# Patient Record
Sex: Female | Born: 1935 | Race: Black or African American | Hispanic: No | Smoking: Never smoker
Health system: Southern US, Community
[De-identification: ages and names within clinical notes are randomized; demographics above are authoritative.]

## PROBLEM LIST (undated history)

## (undated) DIAGNOSIS — D649 Anemia, unspecified: Secondary | ICD-10-CM

## (undated) DIAGNOSIS — I639 Cerebral infarction, unspecified: Secondary | ICD-10-CM

## (undated) DIAGNOSIS — K922 Gastrointestinal hemorrhage, unspecified: Secondary | ICD-10-CM

## (undated) DIAGNOSIS — H409 Unspecified glaucoma: Secondary | ICD-10-CM

## (undated) DIAGNOSIS — N189 Chronic kidney disease, unspecified: Secondary | ICD-10-CM

## (undated) DIAGNOSIS — G35 Multiple sclerosis: Secondary | ICD-10-CM

## (undated) DIAGNOSIS — I5030 Unspecified diastolic (congestive) heart failure: Secondary | ICD-10-CM

## (undated) DIAGNOSIS — I10 Essential (primary) hypertension: Secondary | ICD-10-CM

## (undated) DIAGNOSIS — E7800 Pure hypercholesterolemia, unspecified: Secondary | ICD-10-CM

## (undated) DIAGNOSIS — E039 Hypothyroidism, unspecified: Secondary | ICD-10-CM

## (undated) DIAGNOSIS — F0390 Unspecified dementia without behavioral disturbance: Secondary | ICD-10-CM

## (undated) HISTORY — PX: COLECTOMY: SHX59

## (undated) HISTORY — PX: CATARACT EXTRACTION, BILATERAL: SHX1313

---

## 2016-02-15 ENCOUNTER — Observation Stay (HOSPITAL_COMMUNITY)
Admission: EM | Admit: 2016-02-15 | Discharge: 2016-02-17 | Disposition: A | Discharge status: Skilled Nursing Facility | Payer: Medicare Other | Attending: Family Medicine | Admitting: Family Medicine

## 2016-02-15 ENCOUNTER — Emergency Department (HOSPITAL_COMMUNITY): Payer: Medicare Other

## 2016-02-15 ENCOUNTER — Encounter (HOSPITAL_COMMUNITY): Payer: Self-pay

## 2016-02-15 DIAGNOSIS — E785 Hyperlipidemia, unspecified: Secondary | ICD-10-CM | POA: Diagnosis not present

## 2016-02-15 DIAGNOSIS — F0390 Unspecified dementia without behavioral disturbance: Secondary | ICD-10-CM | POA: Diagnosis not present

## 2016-02-15 DIAGNOSIS — Z7982 Long term (current) use of aspirin: Secondary | ICD-10-CM | POA: Insufficient documentation

## 2016-02-15 DIAGNOSIS — E039 Hypothyroidism, unspecified: Secondary | ICD-10-CM | POA: Diagnosis not present

## 2016-02-15 DIAGNOSIS — D631 Anemia in chronic kidney disease: Secondary | ICD-10-CM | POA: Diagnosis not present

## 2016-02-15 DIAGNOSIS — L03116 Cellulitis of left lower limb: Secondary | ICD-10-CM | POA: Diagnosis not present

## 2016-02-15 DIAGNOSIS — Z8673 Personal history of transient ischemic attack (TIA), and cerebral infarction without residual deficits: Secondary | ICD-10-CM | POA: Insufficient documentation

## 2016-02-15 DIAGNOSIS — R400 Somnolence: Secondary | ICD-10-CM

## 2016-02-15 DIAGNOSIS — G35 Multiple sclerosis: Secondary | ICD-10-CM | POA: Insufficient documentation

## 2016-02-15 DIAGNOSIS — N179 Acute kidney failure, unspecified: Secondary | ICD-10-CM | POA: Insufficient documentation

## 2016-02-15 DIAGNOSIS — Z681 Body mass index (BMI) 19 or less, adult: Secondary | ICD-10-CM | POA: Diagnosis not present

## 2016-02-15 DIAGNOSIS — F0280 Dementia in other diseases classified elsewhere without behavioral disturbance: Secondary | ICD-10-CM

## 2016-02-15 DIAGNOSIS — I5030 Unspecified diastolic (congestive) heart failure: Secondary | ICD-10-CM | POA: Insufficient documentation

## 2016-02-15 DIAGNOSIS — I130 Hypertensive heart and chronic kidney disease with heart failure and stage 1 through stage 4 chronic kidney disease, or unspecified chronic kidney disease: Secondary | ICD-10-CM | POA: Insufficient documentation

## 2016-02-15 DIAGNOSIS — E46 Unspecified protein-calorie malnutrition: Secondary | ICD-10-CM | POA: Diagnosis not present

## 2016-02-15 DIAGNOSIS — L0390 Cellulitis, unspecified: Secondary | ICD-10-CM | POA: Diagnosis present

## 2016-02-15 DIAGNOSIS — N189 Chronic kidney disease, unspecified: Secondary | ICD-10-CM | POA: Insufficient documentation

## 2016-02-15 DIAGNOSIS — R531 Weakness: Secondary | ICD-10-CM | POA: Insufficient documentation

## 2016-02-15 DIAGNOSIS — Z79899 Other long term (current) drug therapy: Secondary | ICD-10-CM | POA: Diagnosis not present

## 2016-02-15 DIAGNOSIS — R296 Repeated falls: Secondary | ICD-10-CM | POA: Diagnosis not present

## 2016-02-15 DIAGNOSIS — M25473 Effusion, unspecified ankle: Secondary | ICD-10-CM | POA: Diagnosis present

## 2016-02-15 DIAGNOSIS — R410 Disorientation, unspecified: Secondary | ICD-10-CM | POA: Diagnosis not present

## 2016-02-15 DIAGNOSIS — E860 Dehydration: Secondary | ICD-10-CM | POA: Insufficient documentation

## 2016-02-15 DIAGNOSIS — G309 Alzheimer's disease, unspecified: Secondary | ICD-10-CM

## 2016-02-15 HISTORY — DX: Unspecified diastolic (congestive) heart failure: I50.30

## 2016-02-15 HISTORY — DX: Essential (primary) hypertension: I10

## 2016-02-15 HISTORY — DX: Pure hypercholesterolemia, unspecified: E78.00

## 2016-02-15 HISTORY — DX: Multiple sclerosis: G35

## 2016-02-15 HISTORY — DX: Gastrointestinal hemorrhage, unspecified: K92.2

## 2016-02-15 HISTORY — DX: Anemia, unspecified: D64.9

## 2016-02-15 HISTORY — DX: Unspecified glaucoma: H40.9

## 2016-02-15 HISTORY — DX: Cerebral infarction, unspecified: I63.9

## 2016-02-15 HISTORY — DX: Unspecified dementia, unspecified severity, without behavioral disturbance, psychotic disturbance, mood disturbance, and anxiety: F03.90

## 2016-02-15 HISTORY — DX: Chronic kidney disease, unspecified: N18.9

## 2016-02-15 HISTORY — DX: Hypothyroidism, unspecified: E03.9

## 2016-02-15 LAB — CBC WITH DIFFERENTIAL/PLATELET
Basophils Absolute: 0 K/uL (ref 0.0–0.1)
Basophils Relative: 0 %
Eosinophils Absolute: 0 K/uL (ref 0.0–0.7)
Eosinophils Relative: 1 %
HCT: 32.1 % — ABNORMAL LOW (ref 36.0–46.0)
Hemoglobin: 10.7 g/dL — ABNORMAL LOW (ref 12.0–15.0)
Lymphocytes Relative: 22 %
Lymphs Abs: 1.1 K/uL (ref 0.7–4.0)
MCH: 30.4 pg (ref 26.0–34.0)
MCHC: 33.3 g/dL (ref 30.0–36.0)
MCV: 91.2 fL (ref 78.0–100.0)
Monocytes Absolute: 0.3 K/uL (ref 0.1–1.0)
Monocytes Relative: 6 %
Neutro Abs: 3.6 K/uL (ref 1.7–7.7)
Neutrophils Relative %: 71 %
Platelets: 170 K/uL (ref 150–400)
RBC: 3.52 MIL/uL — ABNORMAL LOW (ref 3.87–5.11)
RDW: 14.8 % (ref 11.5–15.5)
WBC: 5 K/uL (ref 4.0–10.5)

## 2016-02-15 LAB — URINALYSIS, ROUTINE W REFLEX MICROSCOPIC
Bilirubin Urine: NEGATIVE
Glucose, UA: NEGATIVE mg/dL
Hgb urine dipstick: NEGATIVE
Ketones, ur: NEGATIVE mg/dL
Nitrite: NEGATIVE
Protein, ur: NEGATIVE mg/dL
Specific Gravity, Urine: 1.015 (ref 1.005–1.030)
pH: 5 (ref 5.0–8.0)

## 2016-02-15 LAB — COMPREHENSIVE METABOLIC PANEL WITH GFR
ALT: 39 U/L (ref 14–54)
AST: 53 U/L — ABNORMAL HIGH (ref 15–41)
Albumin: 3 g/dL — ABNORMAL LOW (ref 3.5–5.0)
Alkaline Phosphatase: 52 U/L (ref 38–126)
Anion gap: 12 (ref 5–15)
BUN: 73 mg/dL — ABNORMAL HIGH (ref 6–20)
CO2: 24 mmol/L (ref 22–32)
Calcium: 9.1 mg/dL (ref 8.9–10.3)
Chloride: 106 mmol/L (ref 101–111)
Creatinine, Ser: 1.8 mg/dL — ABNORMAL HIGH (ref 0.44–1.00)
GFR calc Af Amer: 28 mL/min — ABNORMAL LOW (ref >60)
GFR calc non Af Amer: 24 mL/min — ABNORMAL LOW (ref >60)
Glucose, Bld: 169 mg/dL — ABNORMAL HIGH (ref 65–99)
Potassium: 3.6 mmol/L (ref 3.5–5.1)
Sodium: 142 mmol/L (ref 135–145)
Total Bilirubin: 0.4 mg/dL (ref 0.3–1.2)
Total Protein: 6.8 g/dL (ref 6.5–8.1)

## 2016-02-15 LAB — PROTIME-INR
INR: 1.03
PROTHROMBIN TIME: 13.5 s (ref 11.4–15.2)

## 2016-02-15 LAB — I-STAT CG4 LACTIC ACID, ED
Lactic Acid, Venous: 1.55 mmol/L (ref 0.5–1.9)
Lactic Acid, Venous: 2.04 mmol/L (ref 0.5–1.9)

## 2016-02-15 MED ORDER — SODIUM CHLORIDE 0.9 % IV SOLN
Freq: Once | INTRAVENOUS | Status: AC
  Administered 2016-02-15: via INTRAVENOUS

## 2016-02-15 MED ORDER — CEPHALEXIN 250 MG PO CAPS
500.0000 mg | ORAL_CAPSULE | Freq: Once | ORAL | Status: AC
  Administered 2016-02-15: 500 mg via ORAL
  Filled 2016-02-15: qty 2

## 2016-02-15 NOTE — ED Notes (Addendum)
Attempted Iv. Very hard stick. No success

## 2016-02-15 NOTE — ED Notes (Signed)
Pt transported to CT ?

## 2016-02-15 NOTE — H&P (Signed)
Family Medicine Teaching [facility] Admission History and Physical Service Pager: [phone]  Patient name: Kayla Li Medical record number: [id number] Date of birth: 1926/05/15 Age: 83 y.o. Gender: female  Primary Care Provider: No primary care provider on file. Dr. Mirna Mires, [facility] Consultants: none Code Status: FULL  Chief Complaint: fatigue and L leg wound  Assessment and Plan: Kayla Li is a 83 y.o. female presenting with somnolence, fatigue and L leg wound. PMH is significant for HFpEF, HTN, HLD, Multiple sclerosis, hypothyroidism, anemia, CKD, dementia, h/o CVA.  #Delirium 2/2 L leg cellulitis and dehydration. Per daughter patient had an  episode of increased activity/wakefulness for 3 days before becoming somnolent with progressive weakness and falls over the last 1 week and was found to have a L leg wound with erythema and L LE edema. Afebrile with WBC 5.0, lactic acid initially 2.04 improved to 1.55 with minimal PO/IV hydration. Does not meet SIRS or qSOFA, no concern for sepsis. Received po keflex in the ED to cover for both soft tissue infection and possible UTI (low suspicion but has had in past). Does have some baseline generalized weakness but suspect infection is cause for poor po intake and progressive fatigue. Low suspicion for UTI given no dysuria, suprapubic tenderness and UA with only small leukocytes. CXR neg. - Place in observation, attending Dr. Deirdre Priest - continue po keflex 500mg  q6h - follow up on blood and urine cultures - monitor on telemetry given HR in 50s - PT consult, appreciate recommendations - Wound care consult, appreciate recommendations - NS@75cc /hr  #AKI on CKD. Likely due to dehydration secondary to poor po intake. Per care everywhere has baseline creatinine of 1.5 in 2013. On admit creatinine 1.8 -  Gentle hydration NS@75cc /hr given HFpEF - hold nephrotoxic meds - monitor creatinine  #Generalized weakness with falls. Patient  has had 2 falls this weak in the setting of dehydration 2/2 L leg cellulitis. Slid down from bed to floor and did not hit head. CT head neg on admit. Suspect some component of malnutrition since per daughter only eats sporadically at home and prefers soft diet. Albumin mildly low at 3.0.  - PT consult, appreciate recommendations - Nutrition consult, appreciate recommendations  #HFpEF, stable. Does not appear to be in acute exacerbation though does have b/l LE edema in her feet with L greater than R from cellulitis. Last echo (03/15/12) EF>65% G1DD followed by [facility] cardiology. At home on coreg, enalapril, hydralazine - continue home meds except holding ACEi d/t AKI  #HTN. Normotensive on admission. At home on coreg, enalapril, hydralazine. - continue home meds except holding ACEi d/t AKI  #HLD. At home on pravastatin - continue statin  #Hypothyroidism - continue home levothyroxine  #Anemia, stable. baseline appears to be ~10.5 per care everywhere. On at home iron supplementation. - continue home med  #h/o CVA - continue home ASA81 and statin  #h/o Multiple sclerosis, stable. No focal deficits. Low suspicion for flare but does have weakness.   FEN/GI: Soft diet per daughter's request Prophylaxis: lovenox (renally dosed)  Disposition: likely home pending medical improvement  History of Present Illness:  Kayla Li is a 83 y.o. female presenting with fatigue, increased weakness and L leg wound. Daughter is historian.  Per daughter, patient was in usual state of health 1 week ago. At baseline, is oriented to self, talks, can stand with walker but uses wheelchair for mobility, feeds herself. Then patient had an episode of increased activity/wakefulness for 3 days straight before becoming somnolent with  progressive weakness over the last week. She fell on Wednesday and Friday of this week where she was already low to the ground but slid off bed onto floor and did not hit her head.   Daughter states she may have noticed this change in mental status with previous infections like UTIs in the past. Daughter states she noticed a L leg wound on patient's thigh during this time frame that she thought could be a "bed sore." Has noticed redness and swelling surrounding wound as well as swelling in L foot. States patient has been sporadically eating, ate breakfast today but states water intake is decreased because has noticed decreased UOP the last few days.  Review Of Systems: Per HPI with the following additions:   Review of Systems  Constitutional: Positive for malaise/fatigue. Negative for chills and fever.  Respiratory: Negative for shortness of breath.   Cardiovascular: Negative for chest pain.  Gastrointestinal: Negative for abdominal pain, constipation, diarrhea, nausea and vomiting.  Genitourinary: Negative for dysuria, frequency, hematuria and urgency.  Musculoskeletal: Positive for falls.  Skin:       Wound  Neurological: Positive for weakness. Negative for focal weakness.    Patient Active Problem List   Diagnosis Date Noted  . AKI (acute kidney injury) (HCC) 02/15/2016    Past Medical History: Past Medical History:  Diagnosis Date  . Anemia   . CKD (chronic kidney disease)   . Dementia   . Diastolic CHF (HCC)    History of severe LV dysfunction with LVEF 20%, resolved with LVEF 65% 11/2010  . GI bleeding   . Glaucoma   . High cholesterol   . Hypertension   . Hypothyroidism   . MS (multiple sclerosis) (HCC)   . Stroke [facility])     Past Surgical History: Past Surgical History:  Procedure Laterality Date  . CATARACT EXTRACTION, BILATERAL    . COLECTOMY     partial with anastamosis    Social History: Social History  Substance Use Topics  . Smoking status: Never Smoker  . Smokeless tobacco: Never Used  . Alcohol use No   Additional social history: Lives with daughter. No EtOH, recreational drug use.  Please also refer to relevant sections of  EMR.  Family History: Family History  Problem Relation Age of Onset  . Stroke Mother   . Stroke Father     Allergies and Medications: No Known Allergies No current facility-administered medications on file prior to encounter.    No current outpatient prescriptions on file prior to encounter.    Objective: BP (!) 123/37 (BP Location: Right Arm)   Pulse (!) 50   Temp 97.6 F (36.4 C) (Oral)   Resp 18   Ht 5\' 1"  (1.549 m)   Wt 43.1 kg (95 lb)   SpO2 100%   BMI 17.95 kg/m  Exam: General: elderly frail appearing woman lying comfortably in bed, in no distress.  Eyes: PERRL, EOMI. ENTM: MMM, minimal nasal discharge Neck: supple, normal ROM Cardiovascular: bradycardic, normal S1 and S2. No murmurs Respiratory: CTAB, normal effort on room air. Transmitted upper airway noise.  Gastrointestinal: soft, nontender, nondistended + bowel sounds MSK: moving limbs spontaneously, pitting pedal edema of L > R Derm: L lateral thigh has shallow superficial ulceration 2 cm in diameter with surrounding erythema and edema - please see image below. Also has few small superficial scrapes on b/l shins and R knee. Neuro: Oriented to self, awake. Follows commands and can answer simple questions. No focal deficits Psych: appropriate  affect      Labs and Imaging: CBC BMET   Recent Labs Lab 02/15/16 1734  WBC 5.0  HGB 10.7*  HCT 32.1*  PLT 170    Recent Labs Lab 02/15/16 1734  NA 142  K 3.6  CL 106  CO2 24  BUN 73*  CREATININE 1.80*  GLUCOSE 169*  CALCIUM 9.1     Lactic Acid 2.04 -> 1.55  Urinalysis    Component Value Date/Time   COLORURINE YELLOW 02/15/2016 2200   APPEARANCEUR CLEAR 02/15/2016 2200   LABSPEC 1.015 02/15/2016 2200   PHURINE 5.0 02/15/2016 2200   GLUCOSEU NEGATIVE 02/15/2016 2200   HGBUR NEGATIVE 02/15/2016 2200   BILIRUBINUR NEGATIVE 02/15/2016 2200   KETONESUR NEGATIVE 02/15/2016 2200   PROTEINUR NEGATIVE 02/15/2016 2200   NITRITE NEGATIVE 02/15/2016  2200   LEUKOCYTESUR SMALL (A) 02/15/2016 2200     Dg Chest 2 View  Result Date: 02/15/2016 CLINICAL DATA:  Pt unable to give history and aloc. Per ED Notes; Patient here with daughter in which patient resides. Patient here with altered loc, increased sleeping and bilateral ankle swelling the past week. No work of breathing noted, will open eyes and speak with verbal stimulation. Daughter also reports new wound to left posterior hip. afebrile EXAM: CHEST  2 VIEW COMPARISON:  None. FINDINGS: Cardiac silhouette is mildly enlarged. No mediastinal or hilar masses and no evidence of adenopathy. Clear lungs.  No pleural effusion or pneumothorax. Skeletal structures are demineralized but grossly intact. IMPRESSION: No active cardiopulmonary disease. Electronically Signed   By: Amie Portland M.D.   On: 02/15/2016 17:50   Ct Head Wo Contrast  Result Date: 02/15/2016 CLINICAL DATA:  Altered mental status, increased sleeping and BILATERAL ankle swelling over past week, history CHF, hypertension, multiple sclerosis EXAM: CT HEAD WITHOUT CONTRAST TECHNIQUE: Contiguous axial images were obtained from the base of the skull through the vertex without intravenous contrast. COMPARISON:  None FINDINGS: Brain: Generalized atrophy. Normal ventricular morphology. No midline shift or mass effect. Small vessel chronic ischemic changes of deep cerebral white matter. No intracranial hemorrhage, mass lesion, evidence of acute infarction, or extra-axial fluid collection. Vascular: Unremarkable Skull: Intact Sinuses/Orbits: Clear Other: N/A IMPRESSION: Atrophy with small vessel chronic ischemic changes of deep cerebral white matter. No acute intracranial abnormalities. Electronically Signed   By: Ulyses Southward M.D.   On: 02/15/2016 22:51    Leland Her, DO 02/15/2016, 11:23 PM PGY-1, [facility] FPTS Intern pager: [phone], text pages welcome  Upper Level Addendum:  I have seen and evaluated this patient along  with Dr. Artist Pais and reviewed the above note, making necessary revisions in red.   Dani Gobble, MD, PGY-2 02/16/2016 1:47 AM

## 2016-02-15 NOTE — ED Provider Notes (Signed)
[facility]-EMERGENCY DEPT Provider Note   CSN: [id number] Arrival date & time: 02/15/16  1701     History   Chief Complaint Chief Complaint  Patient presents with  . ankle swelling/ altered loc    HPI Nalla Taulbee is a 83 y.o. female.  The history is provided by the patient and a relative.  Altered Mental Status   This is a new problem. Episode onset: 1 week. The problem has been gradually worsening. Associated symptoms include somnolence and weakness (generalized). Pertinent negatives include no confusion.  Her daughter states he has a history of MS. She states she has had gradually worsening fatigue and lethargy for the past week. She is not having any fevers, cough, congestion, sputum production, chest pain, shortness of breath, nausea, vomiting, diarrhea. Patient denies pain at this time. The daughter states that she has been difficult to arouse. She has also had poor PO intake for the past week. The daughters also noticed a sore on her left leg is worsening redness and swelling around it that she just noticed yesterday. She also slid off the bed onto the floor yesterday. Daughter states she did not hit her head.  Past Medical History:  Diagnosis Date  . Anemia   . CKD (chronic kidney disease)   . Diastolic CHF (HCC)   . High cholesterol   . Hypertension   . Hypothyroidism   . MS (multiple sclerosis) [facility])     Patient Active Problem List   Diagnosis Date Noted  . AKI (acute kidney injury) (HCC) 02/15/2016    History reviewed. No pertinent surgical history.  OB History    No data available       Home Medications    Prior to Admission medications   Medication Sig Start Date End Date Taking? Authorizing Provider  aspirin EC 81 MG tablet Take 81 mg by mouth daily.   Yes Historical Provider, MD  Calcium Carb-Cholecalciferol (CALCIUM 600+D3 PO) Take 1 tablet by mouth daily.   Yes Historical Provider, MD  carvedilol (COREG) 25 MG tablet Take 25 mg by mouth 2 (two) times  daily with a meal.   Yes Historical Provider, MD  enalapril (VASOTEC) 10 MG tablet Take 10 mg by mouth daily.   Yes Historical Provider, MD  ferrous sulfate 324 (65 Fe) MG TBEC Take 1 tablet by mouth daily.   Yes Historical Provider, MD  hydrALAZINE (APRESOLINE) 50 MG tablet Take 50 mg by mouth 3 (three) times daily.   Yes Historical Provider, MD  levothyroxine (SYNTHROID, LEVOTHROID) 25 MCG tablet Take 25 mcg by mouth daily before breakfast.   Yes Historical Provider, MD  pravastatin (PRAVACHOL) 80 MG tablet Take 80 mg by mouth at bedtime.   Yes Historical Provider, MD  vitamin B-12 (CYANOCOBALAMIN) 500 MCG tablet Take 500 mcg by mouth daily.   Yes Historical Provider, MD    Family History No family history on file.  Social History Social History  Substance Use Topics  . Smoking status: Never Smoker  . Smokeless tobacco: Never Used  . Alcohol use Not on file     Allergies   Patient has no known allergies.   Review of Systems Review of Systems  Unable to perform ROS: Mental status change  Neurological: Positive for weakness (generalized).  Psychiatric/Behavioral: Negative for confusion.     Physical Exam Updated Vital Signs BP (!) 123/37 (BP Location: Right Arm)   Pulse (!) 50   Temp 97.6 F (36.4 C) (Oral)   Resp 18   Ht  5\' 1"  (1.549 m)   Wt 43.1 kg   SpO2 100%   BMI 17.95 kg/m   Physical Exam  Constitutional: She appears lethargic. She appears cachectic. She appears ill. No distress.  HENT:  Head: Atraumatic.  Nose: Nose normal.  Mouth/Throat: Uvula is midline. Mucous membranes are dry.  Eyes: Conjunctivae are normal. Pupils are equal, round, and reactive to light.  Neck: Neck supple. No spinous process tenderness and no muscular tenderness present. No neck rigidity. Normal range of motion present.  Cardiovascular: Regular rhythm, S1 normal, S2 normal, normal heart sounds, intact distal pulses and normal pulses.  Bradycardia present.   Pulmonary/Chest: Effort  normal. She has decreased breath sounds (throughout). She has no wheezes. She has no rhonchi.  Abdominal: Normal appearance and bowel sounds are normal. She exhibits no distension. There is no tenderness.  Neurological: She appears lethargic. No cranial nerve deficit or sensory deficit. GCS eye subscore is 3. GCS verbal subscore is 4. GCS motor subscore is 6.  Generalized weakness but no focal deficits.   Skin: Skin is warm. Capillary refill takes less than 2 seconds. There is erythema (and induration with central superficial ulceration of about 2cm to L lateral thigh).     2 dime size abrasions to b/l knees, one to each knee.   Nursing note and vitals reviewed.    ED Treatments / Results  Labs (all labs ordered are listed, but only abnormal results are displayed) Labs Reviewed  COMPREHENSIVE METABOLIC PANEL - Abnormal; Notable for the following:       Result Value   Glucose, Bld 169 (*)    BUN 73 (*)    Creatinine, Ser 1.80 (*)    Albumin 3.0 (*)    AST 53 (*)    GFR calc non Af Amer 24 (*)    GFR calc Af Amer 28 (*)    All other components within normal limits  CBC WITH DIFFERENTIAL/PLATELET - Abnormal; Notable for the following:    RBC 3.52 (*)    Hemoglobin 10.7 (*)    HCT 32.1 (*)    All other components within normal limits  URINALYSIS, ROUTINE W REFLEX MICROSCOPIC - Abnormal; Notable for the following:    Leukocytes, UA SMALL (*)    Bacteria, UA RARE (*)    Squamous Epithelial / LPF 0-5 (*)    All other components within normal limits  I-STAT CG4 LACTIC ACID, ED - Abnormal; Notable for the following:    Lactic Acid, Venous 2.04 (*)    All other components within normal limits  URINE CULTURE  CULTURE, BLOOD (ROUTINE X 2)  CULTURE, BLOOD (ROUTINE X 2)  PROTIME-INR  I-STAT CG4 LACTIC ACID, ED    EKG  EKG Interpretation None       Radiology Dg Chest 2 View  Result Date: 02/15/2016 CLINICAL DATA:  Pt unable to give history and aloc. Per ED Notes; Patient here  with daughter in which patient resides. Patient here with altered loc, increased sleeping and bilateral ankle swelling the past week. No work of breathing noted, will open eyes and speak with verbal stimulation. Daughter also reports new wound to left posterior hip. afebrile EXAM: CHEST  2 VIEW COMPARISON:  None. FINDINGS: Cardiac silhouette is mildly enlarged. No mediastinal or hilar masses and no evidence of adenopathy. Clear lungs.  No pleural effusion or pneumothorax. Skeletal structures are demineralized but grossly intact. IMPRESSION: No active cardiopulmonary disease. Electronically Signed   By: Amie Portland M.D.   On: 02/15/2016  17:50   Ct Head Wo Contrast  Result Date: 02/15/2016 CLINICAL DATA:  Altered mental status, increased sleeping and BILATERAL ankle swelling over past week, history CHF, hypertension, multiple sclerosis EXAM: CT HEAD WITHOUT CONTRAST TECHNIQUE: Contiguous axial images were obtained from the base of the skull through the vertex without intravenous contrast. COMPARISON:  None FINDINGS: Brain: Generalized atrophy. Normal ventricular morphology. No midline shift or mass effect. Small vessel chronic ischemic changes of deep cerebral white matter. No intracranial hemorrhage, mass lesion, evidence of acute infarction, or extra-axial fluid collection. Vascular: Unremarkable Skull: Intact Sinuses/Orbits: Clear Other: N/A IMPRESSION: Atrophy with small vessel chronic ischemic changes of deep cerebral white matter. No acute intracranial abnormalities. Electronically Signed   By: Ulyses Southward M.D.   On: 02/15/2016 22:51    Procedures Procedures (including critical care time)  Medications Ordered in ED Medications  0.9 %  sodium chloride infusion (not administered)  cephALEXin (KEFLEX) capsule 500 mg (not administered)     Initial Impression / Assessment and Plan / ED Course  I have reviewed the triage vital signs and the nursing notes.  Pertinent labs & imaging results that  were available during my care of the patient were reviewed by me and considered in my medical decision making (see chart for details).  Clinical Course   83 year old female with a history of MS, HTN, CHF presenting with 1 week of worsening lethargy and generalized weakness. Further history is as above. Exam is as above and notable for an area of erythema and induration with central superficial ulceration over the left lateral thigh concerning for cellulitis. Nonfocal neuro exam. Pt arousable to verbal stimuli.  Labs notable for WBC 5, Hgb 10.7, Cr 1.8, BUN 73, LA 2.04, UA with small leukocytes and rare bacteria. CXR with no acute cardiopulmonary abnormality with no focal opacity or pulm edema. CT head unremarkable.  Due to elevated lactate and likely prerenal AK I, patient placed on normal saline infusion. Bolus not given due to history CHF with an unknown Ef.  Blood cultures drawn and Keflex given for concerns of cellulitis over the left lateral thigh and leukocytes on UA for possible UTI.  Patient will be admitted to family medicine for rehydration and further management.  Patient care discussed and supervised by my attending, Dr. Jacqulyn Bath. Azalia Bilis, MD   Final Clinical Impressions(s) / ED Diagnoses   Final diagnoses:  Somnolence  AKI (acute kidney injury) (HCC)  Dehydration  Cellulitis of left lower extremity    New Prescriptions New Prescriptions   No medications on file     Seith Aikey [place] Tkeya Stencil, MD 02/15/16 2329    Maia Plan, MD 02/16/16 [phone]

## 2016-02-15 NOTE — ED Triage Notes (Signed)
Patient here with daughter in which patient resides. Patient here with altered loc, increased sleeping and bilateral ankle swelling the past week. No work of breathing noted, will open eyes and speak with verbal stimulation. Daughter also reports new wound to left posterior hip. afebrile

## 2016-02-16 DIAGNOSIS — E860 Dehydration: Secondary | ICD-10-CM

## 2016-02-16 DIAGNOSIS — L0390 Cellulitis, unspecified: Secondary | ICD-10-CM | POA: Diagnosis present

## 2016-02-16 DIAGNOSIS — N179 Acute kidney failure, unspecified: Secondary | ICD-10-CM

## 2016-02-16 LAB — BASIC METABOLIC PANEL
Anion gap: 8 (ref 5–15)
BUN: 64 mg/dL — ABNORMAL HIGH (ref 6–20)
CALCIUM: 8.6 mg/dL — AB (ref 8.9–10.3)
CO2: 25 mmol/L (ref 22–32)
Chloride: 112 mmol/L — ABNORMAL HIGH (ref 101–111)
Creatinine, Ser: 1.49 mg/dL — ABNORMAL HIGH (ref 0.44–1.00)
GFR, EST AFRICAN AMERICAN: 35 mL/min — AB (ref >60)
GFR, EST NON AFRICAN AMERICAN: 30 mL/min — AB (ref >60)
GLUCOSE: 94 mg/dL (ref 65–99)
POTASSIUM: 3.3 mmol/L — AB (ref 3.5–5.1)
Sodium: 145 mmol/L (ref 135–145)

## 2016-02-16 LAB — CBC
HEMATOCRIT: 27.7 % — AB (ref 36.0–46.0)
HEMOGLOBIN: 9 g/dL — AB (ref 12.0–15.0)
MCH: 30.1 pg (ref 26.0–34.0)
MCHC: 32.5 g/dL (ref 30.0–36.0)
MCV: 92.6 fL (ref 78.0–100.0)
Platelets: 154 10*3/uL (ref 150–400)
RBC: 2.99 MIL/uL — ABNORMAL LOW (ref 3.87–5.11)
RDW: 15.1 % (ref 11.5–15.5)
WBC: 4.4 10*3/uL (ref 4.0–10.5)

## 2016-02-16 MED ORDER — ACETAMINOPHEN 650 MG RE SUPP: 650.0000 mg | Freq: Four times a day (QID) | RECTAL | Status: DC | PRN

## 2016-02-16 MED ORDER — ENOXAPARIN SODIUM 30 MG/0.3ML ~~LOC~~ SOLN
30.0000 mg | SUBCUTANEOUS | Status: DC
  Administered 2016-02-16 – 2016-02-17 (×2): 30 mg via SUBCUTANEOUS
  Filled 2016-02-16 (×2): qty 0.3

## 2016-02-16 MED ORDER — ASPIRIN EC 81 MG PO TBEC
81.0000 mg | DELAYED_RELEASE_TABLET | Freq: Every day | ORAL | Status: DC
  Administered 2016-02-16 – 2016-02-17 (×2): 81 mg via ORAL
  Filled 2016-02-16 (×2): qty 1

## 2016-02-16 MED ORDER — CEPHALEXIN 500 MG PO CAPS
500.0000 mg | ORAL_CAPSULE | Freq: Four times a day (QID) | ORAL | Status: DC
  Filled 2016-02-16: qty 1

## 2016-02-16 MED ORDER — HYDRALAZINE HCL 50 MG PO TABS
50.0000 mg | ORAL_TABLET | Freq: Three times a day (TID) | ORAL | Status: DC
  Administered 2016-02-16 – 2016-02-17 (×4): 50 mg via ORAL
  Filled 2016-02-16 (×5): qty 1

## 2016-02-16 MED ORDER — FERROUS SULFATE 325 (65 FE) MG PO TABS
325.0000 mg | ORAL_TABLET | Freq: Every day | ORAL | Status: DC
  Administered 2016-02-16 – 2016-02-17 (×2): 325 mg via ORAL
  Filled 2016-02-16 (×2): qty 1

## 2016-02-16 MED ORDER — SODIUM CHLORIDE 0.9 % IV SOLN
INTRAVENOUS | Status: DC
  Administered 2016-02-16 – 2016-02-17 (×2): via INTRAVENOUS

## 2016-02-16 MED ORDER — POLYETHYLENE GLYCOL 3350 17 G PO PACK: 17.0000 g | PACK | Freq: Every day | ORAL | Status: DC | PRN

## 2016-02-16 MED ORDER — CARVEDILOL 25 MG PO TABS
25.0000 mg | ORAL_TABLET | Freq: Two times a day (BID) | ORAL | Status: DC
  Administered 2016-02-16 – 2016-02-17 (×3): 25 mg via ORAL
  Filled 2016-02-16 (×3): qty 1

## 2016-02-16 MED ORDER — CEPHALEXIN 500 MG PO CAPS
500.0000 mg | ORAL_CAPSULE | Freq: Two times a day (BID) | ORAL | Status: DC
  Administered 2016-02-17: 500 mg via ORAL
  Filled 2016-02-16 (×2): qty 1

## 2016-02-16 MED ORDER — FERROUS SULFATE 324 (65 FE) MG PO TBEC: 1.0000 | DELAYED_RELEASE_TABLET | Freq: Every day | ORAL | Status: DC

## 2016-02-16 MED ORDER — PRAVASTATIN SODIUM 40 MG PO TABS
80.0000 mg | ORAL_TABLET | Freq: Every day | ORAL | Status: DC
  Filled 2016-02-16: qty 2

## 2016-02-16 MED ORDER — LEVOTHYROXINE SODIUM 25 MCG PO TABS
25.0000 ug | ORAL_TABLET | Freq: Every day | ORAL | Status: DC
  Administered 2016-02-17: 25 ug via ORAL
  Filled 2016-02-16: qty 1

## 2016-02-16 MED ORDER — ACETAMINOPHEN 325 MG PO TABS: 650.0000 mg | ORAL_TABLET | Freq: Four times a day (QID) | ORAL | Status: DC | PRN

## 2016-02-16 NOTE — Discharge Summary (Signed)
Family Medicine Teaching [facility] Discharge Summary  Patient name: Kayla Li Medical record number: [id number] Date of birth: 04-03-1926 Age: 83 y.o. Gender: female Date of Admission: 02/15/2016  Date of Discharge: 02/17/2016 Admitting Physician: Carney Living, MD  Primary Care Provider: No primary care provider on file. Consultants: none  Indication for Hospitalization: Delirium 2/2 L leg cellulitis and dehydration  Discharge Diagnoses/Problem List:  Delirium 2/2 L leg cellulitis and dehydration AKI on CKD Generalized weakness with falls HFpEF HTN HLD Hypothyroidism Anemia H/o CVA MS Protein calorie malnutrition  Disposition: SNF  Discharge Condition: Stable  Discharge Exam:  General: elderly frail appearing woman lying comfortably in bed, in no distress.  Cardiovascular: RRR, normal S1 and S2. No murmurs Respiratory: CTAB, normal effort on room air. Transmitted upper airway noise. Abdomen: soft, nontender, nondistended + bowel sounds Extremities: L lateral thigh has shallow superficial ulceration covered with dressing c/d/i with surrounding edema but no erythema. Also has few small superficial scrapes on b/l shins and R knee that are also covered with dressings. Continued 1+ pitting edema on dorsum of L foot that is no longer erythematous  Brief Hospital Course:  Kayla Li a 83 y.o.femalepresenting with somnolence, fatigue and L leg wound.   L leg cellulitis Per daughter patient had an episode of increased activity/wakefulness for 3 days before becoming somnolent with progressive weakness and falls over the last 1 week and was found to have a superficial L leg wound on lateral thigh with erythema and L LE edema. She had an elevated lactic acid on admission 2.04 that resolved with minimal PO/IV hydration. She was started on PO keflex to cover for cellulitis and for possible UTIs that she has had in the past that caused a similar clinical picture.  However, urinalysis was negative for nitrites with rare bacteria and patient had no dysuria.   Generalized weakness with falls  Patient had some low impact falls in the setting of dehydration 2/2 L leg cellulitis. She slid down from bed to floor and did not hit head, CT head neg on admit. Suspect some component of malnutrition since per daughter only eats sporadically at home and prefers soft diet. Nutrition was consulted and recommended ensure emliv 3 times a day with meals. PT was consulted and recommended SNF. Patient was stable for discharge to SNF.  Issues for Follow Up:  1. Patient was admitted for left leg cellulitis and was started on Keflex 500 mg twice a day. Patient is instructed to continue these medications for a total of 7 days treatment duration (1/14-1/21). Blood cultures remain negative to date. 2. Nutrition was consult concerning protein calorie malnutrition. Recommendations were made for ensure emliv 3 times a day with meals. 3. Patient experienced generalized weakness with falls secondary to what we believe was her weakness. PT was consulted and recommended SNF placement. This weakness was suspected to be secondary to dehydration and left leg cellulitis. 4. Patient remained hypertensive and continued Coreg, hydralazine. Enalapril was held given AKI which improved during hospitalization. Patient can restart this if creatinine remains improved. Please obtain a BMET to evaluate for resolution of AKI. 5. Patient is instructed to continue statin therapy, aspirin 81 mg daily, and home levothyroxine dose. 6. Family are interested in palliative evaluation considered during SNF care.  Significant Procedures: none  Significant Labs and Imaging:   Recent Labs Lab 02/15/16 1734 02/16/16 1107  WBC 5.0 4.4  HGB 10.7* 9.0*  HCT 32.1* 27.7*  PLT 170 154    Recent Labs  Lab 02/15/16 1734 02/16/16 1107 02/17/16 0748  NA 142 145 147*  K 3.6 3.3* 3.4*  CL 106 112* 117*  CO2 24 25 24    GLUCOSE 169* 94 74  BUN 73* 64* 47*  CREATININE 1.80* 1.49* 1.34*  CALCIUM 9.1 8.6* 8.5*  ALKPHOS 52  --   --   AST 53*  --   --   ALT 39  --   --   ALBUMIN 3.0*  --   --    Results/Tests Pending at Time of Discharge: none  Discharge Medications:  Allergies as of 02/17/2016   No Known Allergies     Medication List    STOP taking these medications   enalapril 10 MG tablet Commonly known as:  VASOTEC     TAKE these medications   aspirin EC 81 MG tablet Take 81 mg by mouth daily.   CALCIUM 600+D3 PO Take 1 tablet by mouth daily.   carvedilol 25 MG tablet Commonly known as:  COREG Take 25 mg by mouth 2 (two) times daily with a meal.   cephALEXin 500 MG capsule Commonly known as:  KEFLEX Take 1 capsule (500 mg total) by mouth every 12 (twelve) hours.   feeding supplement (ENSURE ENLIVE) Liqd Take 237 mLs by mouth 3 (three) times daily between meals.   ferrous sulfate 324 (65 Fe) MG Tbec Take 1 tablet by mouth daily.   hydrALAZINE 50 MG tablet Commonly known as:  APRESOLINE Take 50 mg by mouth 3 (three) times daily.   levothyroxine 25 MCG tablet Commonly known as:  SYNTHROID, LEVOTHROID Take 25 mcg by mouth daily before breakfast.   pravastatin 80 MG tablet Commonly known as:  PRAVACHOL Take 80 mg by mouth at bedtime.   vitamin B-12 500 MCG tablet Commonly known as:  CYANOCOBALAMIN Take 500 mcg by mouth daily.       Discharge Instructions: Please refer to Patient Instructions section of EMR for full details.  Patient was counseled important signs and symptoms that should prompt return to medical care, changes in medications, dietary instructions, activity restrictions, and follow up appointments.   Follow-Up Appointments:   Wendee Beavers, DO 02/17/2016, 4:28 PM PGY-1, [facility] Family Medicine

## 2016-02-16 NOTE — Evaluation (Signed)
Physical Therapy Evaluation Patient Details Name: Kayla Li MRN: [id number] DOB: May 04, 1926 Today's Date: 02/16/2016   History of Present Illness  Kayla Li is a 83 y.o. female presenting with somnolence, fatigue and L leg wound. PMH is significant for HFpEF, HTN, HLD, Multiple sclerosis, hypothyroidism, anemia, CKD, dementia, h/o CVA.  Clinical Impression   Pt admitted with above diagnosis. Pt currently with functional limitations due to the deficits listed below (see PT Problem List). Presents with difficulty transferring, LLE weakness effecting ability to move; Daughter reports difficulty managing at home; Rec post-acute rehab to maximize independence and safety with mobility;  Pt will benefit from skilled PT to increase their independence and safety with mobility to allow discharge to the venue listed below.    Noted Palliative Care Team Consult pending as well; Will be happy to take their lead as to if and how PT can fit into Kayla Li's goals of care.      Follow Up Recommendations SNF    Equipment Recommendations  Other (comment) (TBD at SNF)    Recommendations for Other Services       Precautions / Restrictions Precautions Precautions: Fall Precaution Comments: L hip wound dressed      Mobility  Bed Mobility Overal bed mobility: Needs Assistance Bed Mobility: Supine to Sit     Supine to sit: Max assist     General bed mobility comments: Max assist to clear feet from EOB and elevate trunk to sit  Transfers Overall transfer level: Needs assistance Equipment used: 1 person hand held assist Transfers: Stand Pivot Transfers   Stand pivot transfers: Max assist       General transfer comment: Noted good recruitment of LE musculature in resopnse to shifting center of mass over feet, but very weak; used gait belt and knees blocked to safely transfer to chiar  Ambulation/Gait                Stairs            Wheelchair Mobility     Modified Rankin (Stroke Patients Only)       Balance Overall balance assessment: Needs assistance Sitting-balance support: Bilateral upper extremity supported Sitting balance-Leahy Scale: Fair       Standing balance-Leahy Scale: Zero                               Pertinent Vitals/Pain Pain Assessment: No/denies pain    Home Living Family/patient expects to be discharged to:: Skilled nursing facility                 Additional Comments: Daughter requesting SNF as managing at home is becoming difficult; It doesn't seem that pt is fully aware this is the plan    Prior Function Level of Independence: Needs assistance   Gait / Transfers Assistance Needed: in home walking with RW  ADL's / Homemaking Assistance Needed: prn assist        Hand Dominance        Extremity/Trunk Assessment   Upper Extremity Assessment Upper Extremity Assessment: Generalized weakness    Lower Extremity Assessment Lower Extremity Assessment: Generalized weakness;RLE deficits/detail;LLE deficits/detail LLE Deficits / Details: Noted L hip wound, dressed; PROM grossly WFL for simple mobility tasks; active ankle and knee motion, though weak, grossly 3-/5 knee extension and ankle dorsiflexion       Communication   Communication: No difficulties  Cognition Arousal/Alertness: Awake/alert Behavior During Therapy: WFL for tasks assessed/performed  Overall Cognitive Status: Impaired/Different from baseline Area of Impairment: Safety/judgement         Safety/Judgement: Decreased awareness of safety;Decreased awareness of deficits          General Comments      Exercises     Assessment/Plan    PT Assessment Patient needs continued PT services  PT Problem List Decreased strength;Decreased range of motion;Decreased activity tolerance;Decreased balance;Decreased mobility;Decreased coordination;Decreased cognition;Decreased knowledge of use of DME;Decreased safety  awareness          PT Treatment Interventions DME instruction;Gait training;Functional mobility training;Therapeutic activities;Therapeutic exercise;Patient/family education;Balance training;Neuromuscular re-education;Cognitive remediation    PT Goals (Current goals can be found in the Care Plan section)  Acute Rehab PT Goals Patient Stated Goal: did not state PT Goal Formulation: With family Time For Goal Achievement: 03/01/16 Potential to Achieve Goals: Good    Frequency Min 2X/week   Barriers to discharge        Co-evaluation               End of Session Equipment Utilized During Treatment: Gait belt Activity Tolerance: Patient tolerated treatment well Patient left: in chair;with call bell/phone within reach;with chair alarm set;with family/visitor present Nurse Communication: Mobility status    Functional Assessment Tool Used: Clinical Judgement Functional Limitation: Mobility: Walking and moving around Mobility: Walking and Moving Around Current Status [phone]): At least 40 percent but less than 60 percent impaired, limited or restricted Mobility: Walking and Moving Around Goal Status [phone]): At least 1 percent but less than 20 percent impaired, limited or restricted    Time: 5625-6389 PT Time Calculation (min) (ACUTE ONLY): 27 min   Charges:   PT Evaluation $PT Eval Moderate Complexity: 1 Procedure PT Treatments $Therapeutic Activity: 8-22 mins   PT G Codes:   PT G-Codes **NOT FOR INPATIENT CLASS** Functional Assessment Tool Used: Clinical Judgement Functional Limitation: Mobility: Walking and moving around Mobility: Walking and Moving Around Current Status (H7342): At least 40 percent but less than 60 percent impaired, limited or restricted Mobility: Walking and Moving Around Goal Status [phone]): At least 1 percent but less than 20 percent impaired, limited or restricted    Kayla Li 02/16/2016, 6:30 PM  Van Clines, PT  Acute  Rehabilitation Services Pager [phone] Office [phone]

## 2016-02-16 NOTE — ED Notes (Signed)
Report to 5 west, pt drinking water thru straw with a lot of encouragement, pt remains very sleepy

## 2016-02-16 NOTE — ED Notes (Signed)
Family in room await ready bed

## 2016-02-16 NOTE — Progress Notes (Signed)
Family Medicine Teaching Service Daily Progress Note Intern Pager: [phone]  Patient name: Kayla Li Medical record number: [id number] Date of birth: 10-11-1926 Age: 83 y.o. Gender: female  Primary Care Provider: No primary care provider on file. Consultants: none Code Status: FULL  Pt Overview and Major Events to Date:  02/15/16 Placed in observation  Assessment and Plan: Kayla Li is a 83 y.o. female presenting with somnolence, fatigue and L leg wound. PMH is significant for HFpEF, HTN, HLD, Multiple sclerosis, hypothyroidism, anemia, CKD, dementia, h/o CVA.  #Delirium 2/2 L leg cellulitis and dehydration. Per daughter patient had an episode of increased activity/wakefulness for 3 days before becoming somnolent with progressive weakness and falls over the last 1 week and was found to have a L leg wound with erythema and L LE edema. Afebrile with WBC 5.0, lactic acid initially 2.04 improved to 1.55 with minimal PO/IV hydration. Does not meet SIRS or qSOFA, no concern for sepsis. Received po keflex in the ED to cover for both soft tissue infection and possible UTI (low suspicion but has had in past). Does have some baseline generalized weakness but suspect infection is cause for poor po intake and progressive fatigue. Low suspicion for UTI given no dysuria, suprapubic tenderness and UA with only small leukocytes. CXR neg.  - continue po keflex 500mg  q6h - follow up on blood and urine cultures - monitor on telemetry given HR in 50s - PT consult, appreciate recommendations - Wound care consult, appreciate recommendations - NS@75cc /hr - monitor po intake, if patient does well with breakfast this morning and continues to do well on po ABX may be able to go home today.  #AKI on CKD. Likely due to dehydration secondary to poor po intake. Per care everywhere has baseline creatinine of 1.5 in 2013. On admit creatinine 1.8 -  Gentle hydration NS@75cc /hr given HFpEF - hold nephrotoxic  meds - monitor creatinine  #Generalized weakness with falls. Patient has had 2 falls this weak in the setting of dehydration 2/2 L leg cellulitis. Slid down from bed to floor and did not hit head. CT head neg on admit. Suspect some component of malnutrition since per daughter only eats sporadically at home and prefers soft diet. Albumin mildly low at 3.0.  - PT consult, appreciate recommendations - Nutrition consult, appreciate recommendations  #HFpEF, stable. Does not appear to be in acute exacerbation though does have b/l LE edema in her feet with L greater than R from cellulitis. Last echo (03/15/12) EF>65% G1DD followed by [facility] cardiology. At home on coreg, enalapril, hydralazine - continue home meds except holding ACEi d/t AKI  #HTN. Normotensive on admission. At home on coreg, enalapril, hydralazine. - continue home meds except holding ACEi d/t AKI  #HLD. At home on pravastatin - continue statin  #Hypothyroidism - continue home levothyroxine  #Anemia, stable. baseline appears to be ~10.5 per care everywhere. On at home iron supplementation. - continue home med  #h/o CVA - continue home ASA81 and statin  #h/o Multiple sclerosis, stable. No focal deficits. Low suspicion for flare but does have weakness.   FEN/GI: Soft diet per daughter's request Prophylaxis: lovenox (renally dosed)  Disposition: pending medical improvement, possibly home today  Subjective:  Seen in the ED. Per daughter, patient has been sleeping all night. Has not taken much po after the water she drank with her keflex. Was talking a little before going to sleep. Daughter is wondering when patient will get a floor room.  Objective: Temp:  [97.5 F (36.4  C)-97.6 F (36.4 C)] 97.6 F (36.4 C) (01/13 2013) Pulse Rate:  [50-78] 78 (01/14 0730) Resp:  [18] 18 (01/14 0730) BP: (123-134)/(37-48) 124/48 (01/14 0730) SpO2:  [100 %] 100 % (01/14 0730) Weight:  [43.1 kg (95 lb)] 43.1 kg (95 lb) (01/13  1713) Physical Exam: General:  elderly frail appearing woman sleeping comfortably in bed, in no distress.  Cardiovascular:  RRR, normal S1 and S2. No murmurs Respiratory: CTAB, normal effort on room air. Transmitted upper airway noise.  Abdomen: soft, nontender, nondistended + bowel sounds Extremities: L lateral thigh has shallow superficial ulceration 2 cm in diameter with surrounding erythema and edema. Also has few small superficial scrapes on b/l shins and R knee.  Laboratory:  Recent Labs Lab 02/15/16 1734  WBC 5.0  HGB 10.7*  HCT 32.1*  PLT 170    Recent Labs Lab 02/15/16 1734  NA 142  K 3.6  CL 106  CO2 24  BUN 73*  CREATININE 1.80*  CALCIUM 9.1  PROT 6.8  BILITOT 0.4  ALKPHOS 52  ALT 39  AST 53*  GLUCOSE 169*    Lactic Acid 2.04 -> 1.55  Imaging/Diagnostic Tests: Dg Chest 2 View  Result Date: 02/15/2016 CLINICAL DATA:  Pt unable to give history and aloc. Per ED Notes; Patient here with daughter in which patient resides. Patient here with altered loc, increased sleeping and bilateral ankle swelling the past week. No work of breathing noted, will open eyes and speak with verbal stimulation. Daughter also reports new wound to left posterior hip. afebrile EXAM: CHEST  2 VIEW COMPARISON:  None. FINDINGS: Cardiac silhouette is mildly enlarged. No mediastinal or hilar masses and no evidence of adenopathy. Clear lungs.  No pleural effusion or pneumothorax. Skeletal structures are demineralized but grossly intact. IMPRESSION: No active cardiopulmonary disease. Electronically Signed   By: Amie Portland M.D.   On: 02/15/2016 17:50   Ct Head Wo Contrast  Result Date: 02/15/2016 CLINICAL DATA:  Altered mental status, increased sleeping and BILATERAL ankle swelling over past week, history CHF, hypertension, multiple sclerosis EXAM: CT HEAD WITHOUT CONTRAST TECHNIQUE: Contiguous axial images were obtained from the base of the skull through the vertex without intravenous  contrast. COMPARISON:  None FINDINGS: Brain: Generalized atrophy. Normal ventricular morphology. No midline shift or mass effect. Small vessel chronic ischemic changes of deep cerebral white matter. No intracranial hemorrhage, mass lesion, evidence of acute infarction, or extra-axial fluid collection. Vascular: Unremarkable Skull: Intact Sinuses/Orbits: Clear Other: N/A IMPRESSION: Atrophy with small vessel chronic ischemic changes of deep cerebral white matter. No acute intracranial abnormalities. Electronically Signed   By: Ulyses Southward M.D.   On: 02/15/2016 22:51    Leland Her, DO 02/16/2016, 7:20 AM PGY-1, [facility] FPTS Intern pager: [phone], text pages welcome

## 2016-02-16 NOTE — Consult Note (Signed)
WOC Nurse wound consult note Reason for Consult: Darkened, raised and discolored area on the left lateral hip with induration in the surrounding area.  Area of partial thickness tissue loss at superior edge measuring 1cm x 2cm x 0.1cm. According to two daughters, patient fell recently, but was without apparent injury.  This lesion appeared on Wednesday, 02/12/16.  Patient exhibits no pain on palpation of area. Wound type: Suspect trauma, neoplasm, not pressure Pressure Injury POA: No Measurement: 3cm x 4.5cm of dark purple, elevated and indurated tissue.  Small area at superior edge where epidermis has been lost measuring 1cm x 2cm x 0.1cm. This area is red, moist but not exudative. Wound bed:As described above Drainage (amount, consistency, odor) As described above (None) Periwound: Intact, dry. Dressing procedure/placement/frequency: This is an unusual presentation of an alteration in skin integrity and I defer to the expertise of my medical colleagues on the etiology.  If you wish, consult dermatology post discharge for a definitive diagnosis, biopsy, etc. In the meantime,  I have implemented a conservative POC that entails twice daily cleansing and application of a dressing with antimicrobial and astringent properties (xeroform) topped with a silicone foam dressing.  Patient is to be positioned off of the area.  WOC nursing team will not follow, but will remain available to this patient, the nursing and medical teams.  Please re-consult if needed, if area deteriorates in a manner inconsistent with patient's overall status.  Thanks, Ladona Mow, MSN, RN, GNP, [facility]  Pager# [phone]

## 2016-02-16 NOTE — H&P (Signed)
Received report from ED at 1000.

## 2016-02-16 NOTE — H&P (Signed)
Pt arrived at the unit at 1015.

## 2016-02-16 NOTE — Progress Notes (Signed)
Kayla Li is a 83 y.o. female patient admitted from ED awake, alert - oriented  X 4 - no acute distress noted.  VSS - Blood pressure (!) 144/37, pulse 71, temperature 99 F (37.2 C), temperature source Oral, resp. rate 14, height 5\' 1"  (1.549 m), weight 43.1 kg (95 lb), SpO2 98 %.    IV in place, occlusive dsg intact without redness.  Orientation to room, and floor completed with information packet given to patient/family. .  Admission INP armband ID verified with patient/family, and in place.   SR up x 2, fall assessment complete, with patient and family able to verbalize understanding of risk associated with falls, and verbalized understanding to call nsg before up out of bed.  Call light within reach, patient able to voice, and demonstrate understanding.  Skin, clean-dry, a stage 2 pressure ulcer was noted on left hip.   Will cont to eval and treat per MD orders.  Melvenia Needles, RN 02/16/2016 10:26 AM

## 2016-02-17 ENCOUNTER — Other Ambulatory Visit: Payer: Self-pay | Admitting: Family Medicine

## 2016-02-17 DIAGNOSIS — R400 Somnolence: Secondary | ICD-10-CM | POA: Diagnosis not present

## 2016-02-17 DIAGNOSIS — N179 Acute kidney failure, unspecified: Secondary | ICD-10-CM | POA: Diagnosis not present

## 2016-02-17 DIAGNOSIS — F0280 Dementia in other diseases classified elsewhere without behavioral disturbance: Secondary | ICD-10-CM

## 2016-02-17 DIAGNOSIS — F0281 Dementia in other diseases classified elsewhere with behavioral disturbance: Secondary | ICD-10-CM

## 2016-02-17 DIAGNOSIS — L03116 Cellulitis of left lower limb: Secondary | ICD-10-CM | POA: Diagnosis not present

## 2016-02-17 DIAGNOSIS — E860 Dehydration: Secondary | ICD-10-CM | POA: Diagnosis not present

## 2016-02-17 DIAGNOSIS — G309 Alzheimer's disease, unspecified: Secondary | ICD-10-CM

## 2016-02-17 DIAGNOSIS — G308 Other Alzheimer's disease: Secondary | ICD-10-CM

## 2016-02-17 LAB — URINE CULTURE: Culture: NO GROWTH

## 2016-02-17 LAB — BASIC METABOLIC PANEL
ANION GAP: 6 (ref 5–15)
BUN: 47 mg/dL — AB (ref 6–20)
CHLORIDE: 117 mmol/L — AB (ref 101–111)
CO2: 24 mmol/L (ref 22–32)
Calcium: 8.5 mg/dL — ABNORMAL LOW (ref 8.9–10.3)
Creatinine, Ser: 1.34 mg/dL — ABNORMAL HIGH (ref 0.44–1.00)
GFR calc Af Amer: 40 mL/min — ABNORMAL LOW (ref >60)
GFR, EST NON AFRICAN AMERICAN: 35 mL/min — AB (ref >60)
Glucose, Bld: 74 mg/dL (ref 65–99)
POTASSIUM: 3.4 mmol/L — AB (ref 3.5–5.1)
Sodium: 147 mmol/L — ABNORMAL HIGH (ref 135–145)

## 2016-02-17 MED ORDER — CEPHALEXIN 500 MG PO CAPS: 500.0000 mg | ORAL_CAPSULE | Freq: Two times a day (BID) | ORAL | 0 refills | Status: AC

## 2016-02-17 MED ORDER — ENSURE ENLIVE PO LIQD: 237.0000 mL | Freq: Three times a day (TID) | ORAL | 0 refills | Status: DC

## 2016-02-17 MED ORDER — ENSURE ENLIVE PO LIQD: 237.0000 mL | Freq: Three times a day (TID) | ORAL | Status: DC

## 2016-02-17 NOTE — NC FL2 (Signed)
[place] MEDICAID FL2 LEVEL OF CARE SCREENING TOOL     IDENTIFICATION  Patient Name: Kayla Li Birthdate: 1926/09/08 Sex: female Admission Date (Current Location): 02/15/2016  [facility] and [place] Number:  Producer, television/film/video and Address:  [facility], [address] [place], [place] [address]      Provider Number: [id number]  Attending Physician Name and Address:  Carney Living, MD  Relative Name and Phone Number:  Dtr - [phone]    Current Level of Care: Hospital Recommended Level of Care: Skilled Nursing Facility Prior Approval Number:    Date Approved/Denied:   PASRR Number: [id number]  Discharge Plan: SNF    Current Diagnoses: Patient Active Problem List   Diagnosis Date Noted  . Cellulitis 02/16/2016  . Dehydration   . AKI (acute kidney injury) (HCC) 02/15/2016    Orientation RESPIRATION BLADDER Height & Weight     Self  Normal Continent Weight: 96 lb 1.9 oz (43.6 kg) Height:   (pt unable to tell me. )  BEHAVIORAL SYMPTOMS/MOOD NEUROLOGICAL BOWEL NUTRITION STATUS      Continent Diet (See DC Summary)  AMBULATORY STATUS COMMUNICATION OF NEEDS Skin   Limited Assist Verbally Normal                       Personal Care Assistance Level of Assistance  Bathing, Dressing Bathing Assistance: Limited assistance   Dressing Assistance: Limited assistance     Functional Limitations Info  Sight, Hearing, Speech Sight Info: Adequate Hearing Info: Adequate Speech Info: Adequate    SPECIAL CARE FACTORS FREQUENCY  PT (By licensed PT), OT (By licensed OT), Speech therapy     PT Frequency: 5x wk OT Frequency: 5x wk     Speech Therapy Frequency: 5x wk      Contractures Contractures Info: Not present    Additional Factors Info  Code Status, Allergies Code Status Info: Full Allergies Info: No Known Allergies           Current Medications (02/17/2016):  This is the current hospital active medication  list Current Facility-Administered Medications  Medication Dose Route Frequency Provider Last Rate Last Dose  . acetaminophen (TYLENOL) tablet 650 mg  650 mg Oral Q6H PRN Leland Her, DO       Or  . acetaminophen (TYLENOL) suppository 650 mg  650 mg Rectal Q6H PRN Leland Her, DO      . aspirin EC tablet 81 mg  81 mg Oral Daily Leland Her, DO   81 mg at 02/17/16 1011  . carvedilol (COREG) tablet 25 mg  25 mg Oral BID WC Leland Her, DO   25 mg at 02/17/16 1011  . cephALEXin (KEFLEX) capsule 500 mg  500 mg Oral Q12H Carney Living, MD   500 mg at 02/17/16 1012  . enoxaparin (LOVENOX) injection 30 mg  30 mg Subcutaneous Q24H Leland Her, DO   30 mg at 02/17/16 1013  . ferrous sulfate tablet 325 mg  325 mg Oral Q breakfast Renaee Munda, RPH   325 mg at 02/17/16 1013  . hydrALAZINE (APRESOLINE) tablet 50 mg  50 mg Oral TID Leland Her, DO   50 mg at 02/17/16 1012  . levothyroxine (SYNTHROID, LEVOTHROID) tablet 25 mcg  25 mcg Oral QAC breakfast Leland Her, DO   25 mcg at 02/17/16 1012  . polyethylene glycol (MIRALAX / GLYCOLAX) packet 17 g  17 g Oral Daily PRN Elsia  Rodolph Bong, DO      . pravastatin (PRAVACHOL) tablet 80 mg  80 mg Oral QHS Leland Her, DO         Discharge Medications: Please see discharge summary for a list of discharge medications.  Relevant Imaging Results:  Relevant Lab Results:   Additional Information    Jissel Slavens B, LCSWA

## 2016-02-17 NOTE — Progress Notes (Signed)
Pt prepared for d/c to SNF. IV d/c'd. Skin intact except as charted in most recent assessments. Vitals are stable. Report called to receiving facility. Pt to be transported by ambulance service. Daughter at bedside and talking with SW.

## 2016-02-17 NOTE — Progress Notes (Addendum)
Initial Nutrition Assessment  DOCUMENTATION CODES:   Not applicable  INTERVENTION:   -Ensure Enlive po TID, each supplement provides 350 kcal and 20 grams of protein  NUTRITION DIAGNOSIS:   Inadequate oral intake related to poor appetite as evidenced by meal completion < 25%.  GOAL:   Patient will meet greater than or equal to 90% of their needs  MONITOR:   PO intake, Supplement acceptance, Labs, Weight trends, Skin, I & O's  REASON FOR ASSESSMENT:   Consult Assessment of nutrition requirement/status  ASSESSMENT:   Kayla Li is a 83 y.o. female presenting with somnolence, fatigue and L leg wound. PMH is significant for HFpEF, HTN, HLD, Multiple sclerosis, hypothyroidism, anemia, CKD, dementia, h/o CVA.  Pt admitted with lt leg cellulitis.   Case discussed with RN prior to visit. Pt will interact minimally and has poor oral intake. She is on a soft diet per pt daughter request. RN also confirmed that palliative care consultation is pending.   Spoke with pt at bedside, who reports good appetite. However, pt consumed very little food on her lunch tray today, expect for sweet tea. Meal completion 0% per doc flowsheets. Due to poor oral intake, pt would benefit from oral nutrition supplements. RD to order.   Pt denies any weight loss. Per Care Everywhere records, noted wt of 86# in December 2015.   Pt refused nutrition-focused physical exam at time of visit, despite encouragement from this RD.   Received CWCON note from 02/16/16; pt with partial thickness skin loss at left hip likely related to recent fall and lt leg cellulitis.   Labs reviewed: K: 3.4.   Diet Order:  DIET SOFT Room service appropriate? Yes; Fluid consistency: Thin  Skin:  Wound (see comment) (partial thickness skin loss, lt lateral hip, lt leg cellulitis)  Last BM:  PTA  Height:   Ht Readings from Last 1 Encounters:  02/17/16 4\' 11"  (1.499 m)    Weight:   Wt Readings from Last 1 Encounters:   02/17/16 96 lb (43.5 kg)    Ideal Body Weight:  44.5 kg  BMI:  Body mass index is 19.39 kg/m.  Estimated Nutritional Needs:   Kcal:  1100-1300  Protein:  50-65 grams  Fluid:  >1.1 L  EDUCATION NEEDS:   No education needs identified at this time  Haidar Muse A. Mayford Knife, RD, LDN, CDE Pager: [phone] After hours Pager: [phone]

## 2016-02-17 NOTE — Clinical Social Work Placement (Signed)
   CLINICAL SOCIAL WORK PLACEMENT  NOTE  Date:  02/17/2016  Patient Details  Name: Kayla Li MRN: [id number] Date of Birth: [birth year]  Clinical Social Work is seeking post-discharge placement for this patient at the Skilled  Nursing Facility level of care (*CSW will initial, date and re-position this form in  chart as items are completed):  Yes   Patient/family provided with [place] Clinical Social Work Department's list of facilities offering this level of care within the geographic area requested by the patient (or if unable, by the patient's family).  Yes   Patient/family informed of their freedom to choose among providers that offer the needed level of care, that participate in Medicare, Medicaid or managed care program needed by the patient, have an available bed and are willing to accept the patient.  Yes   Patient/family informed of [facility]'s ownership interest in [facility] and [facility], as well as of the fact that they are under no obligation to receive care at these facilities.  PASRR submitted to EDS on       PASRR number received on       Existing PASRR number confirmed on 02/17/16     FL2 transmitted to all facilities in geographic area requested by pt/family on 02/17/16     FL2 transmitted to all facilities within larger geographic area on       Patient informed that his/her managed care company has contracts with or will negotiate with certain facilities, including the following:        Yes   Patient/family informed of bed offers received.  Patient chooses bed at  [facility])     Physician recommends and patient chooses bed at      Patient to be transferred to  [facility]) on 02/17/16.  Patient to be transferred to facility by  [facility])     Patient family notified on 02/10/16 of transfer.  Name of family member notified:  Dtr-Teresa     PHYSICIAN Please sign FL2     Additional Comment:     _______________________________________________ Norlene Duel, LCSWA 02/17/2016, 4:48 PM

## 2016-02-17 NOTE — Clinical Social Work Note (Signed)
Clinical Social Worker notified patient is ready to DC, CSW facilitated patient discharge including contacting patient family and facility to confirm patient discharge plans and bed availability. Clinical information faxed to facility and pt/family agreeable with plan.CSW arranged ambulance transport via [facility] to [facility]. RN to call report prior to discharge.  Clinical Social Worker will sign off for now as social work intervention is no longer needed. Please consult [place] again if new need arises.  Akeylah Hendel B. Gean Quint Clinical Social Work Dept Weekend Social Worker [phone] 4:46 PM

## 2016-02-17 NOTE — Progress Notes (Signed)
Palliative Medicine consult noted. Due to high referral volume, there may be a delay seeing this patient. Please call the Palliative Medicine Team office at [phone] if recommendations are needed in the interim.  Thank you for inviting [place] to see this patient.  Margret Chance Pearley Millington, RN, BSN, [facility] 02/17/2016 9:24 AM Cell [phone] 8:00-4:00 Monday-Friday Office [phone]

## 2016-02-17 NOTE — Progress Notes (Signed)
Family Medicine Teaching Service Daily Progress Note Intern Pager: [phone]  Patient name: Kayla Li Medical record number: [id number] Date of birth: 06-19-1926 Age: 83 y.o. Gender: female  Primary Care Provider: No primary care provider on file. Consultants: none Code Status: FULL  Pt Overview and Major Events to Date:  02/15/16 Placed in observation  Assessment and Plan: Kayla Li is a 83 y.o. female presenting with somnolence, fatigue and L leg wound. PMH is significant for HFpEF, HTN, HLD, Multiple sclerosis, hypothyroidism, anemia, CKD, dementia, h/o CVA.  #L leg cellulitis, improving. Has L leg wound with erythema and L LE edema now without erythema. Afebrile, no leukocytosis, Received po keflex in the ED to cover for both soft tissue infection and possible UTI (low suspicion but has had in past).  Low suspicion for UTI given no dysuria, suprapubic tenderness and UA with only small leukocytes. CXR neg.  - continue po keflex 500mg  q12h - follow up on blood and urine cultures - PT recommended SNF - SW consulted for SNF placement - Per wound care, L leg wound is unusual presentation and may be beneficial for outpatient dermatology followup. Recommended conservative care.  #Generalized weakness with falls likely 2/2 protein calorie malnutrition. Patient had 2 falls prior to admission in the setting of dehydration 2/2 L leg cellulitis. Diastolic hypotension unlikely to be cause of falls given patient was holding on when slowly slid down from bed to floor and did not hit head which is more consistent with deconditioning. CT head neg.  Does have some baseline generalized weakness but suspect infection is cause for poor po intake and progressive fatigue. Suspect some component of malnutrition since only eats sporadically at home and prefers soft diet. Albumin mildly low at 3.0. Daughter requested palliative care for GOC discussion - PT recommending SNF - SW consult for SNF  placement - Nutrition consult, appreciate recommendations - Palliative consult, appreciate recommendations  #HTN. Normotensive on admission, now BP 165/41. At home on coreg, enalapril, hydralazine. Reluctant to start additional antihypertensives given low DBP. Suspect IV hydration is contributing.  - continue home meds except holding ACEi d/t AKI, may restart tomorrow if creatinine stable since patient has been stable on home regimen with BP 123/27 on admit and daughter had endorsed good compliance. - discontinue IVF  #Delirium, resolved. Has baseline dementia. Per daughter patient had an episode of increased activity/wakefulness for 3 days before becoming somnolent with progressive weakness and falls over the last 1 week and was found to have a L leg wound with erythema and L LE edema. Improved on po ABX. Uncertain of baseline mental status but per daughter patient talks sparingly at home, is oriented only to self, feeds self, stands with walker and uses wheelchair to ambulate. - May need to assess if patient has capacity with [facility]   #AKI on CKD, resolved. Likely due to dehydration secondary to poor po intake. Per care everywhere has baseline creatinine of 1.5 in 2013. Cr 1.8 >1.49 > 1.36 - Discontinue IVF given improvement in creatinine and increasing systolic BPs. - hold nephrotoxic meds - monitor creatinine  #HFpEF, stable. Does not appear to be in acute exacerbation though does have b/l LE edema in her feet with L greater than R from cellulitis. Last echo (03/15/12) EF>65% G1DD followed by [facility] cardiology. At home on coreg, enalapril, hydralazine - continue home meds except holding ACEi d/t AKI   #HLD. At home on pravastatin - continue statin  #Hypothyroidism - continue home levothyroxine  #Anemia, stable. baseline  appears to be ~10.5 per care everywhere. On at home iron supplementation. - continue home med  #h/o CVA - continue home ASA81 and statin  #h/o Multiple  sclerosis, stable. No focal deficits. Low suspicion for flare but does have weakness.   FEN/GI: Soft diet per daughter's request Prophylaxis: lovenox (renally dosed)  Disposition: awaiting SNF placement  Subjective:  States has no concerns today. Denies pain in L leg. States is drinking water.  Objective: Temp:  [97.9 F (36.6 C)-99 F (37.2 C)] 98.4 F (36.9 C) (01/15 0525) Pulse Rate:  [58-71] 63 (01/15 0525) Resp:  [12-18] 18 (01/15 0525) BP: (144-185)/(37-48) 165/41 (01/15 0525) SpO2:  [98 %-100 %] 100 % (01/15 0525) Weight:  [43.6 kg (96 lb 1.9 oz)] 43.6 kg (96 lb 1.9 oz) (01/14 1021) Physical Exam: General:  elderly frail appearing woman lying comfortably in bed, in no distress.  Cardiovascular:  RRR, normal S1 and S2. No murmurs Respiratory: CTAB, normal effort on room air. Transmitted upper airway noise.  Abdomen: soft, nontender, nondistended + bowel sounds Extremities: L lateral thigh has shallow superficial ulceration covered with dressing c/d/i with surrounding edema but no erythema. Also has few small superficial scrapes on b/l shins and R knee that are also covered with dressings. Continued 1+ pitting edema on dorsum of L foot that is no longer erythematous  Laboratory:  Recent Labs Lab 02/15/16 1734 02/16/16 1107  WBC 5.0 4.4  HGB 10.7* 9.0*  HCT 32.1* 27.7*  PLT 170 154    Recent Labs Lab 02/15/16 1734 02/16/16 1107 02/17/16 0748  NA 142 145 147*  K 3.6 3.3* 3.4*  CL 106 112* 117*  CO2 24 25 24   BUN 73* 64* 47*  CREATININE 1.80* 1.49* 1.34*  CALCIUM 9.1 8.6* 8.5*  PROT 6.8  --   --   BILITOT 0.4  --   --   ALKPHOS 52  --   --   ALT 39  --   --   AST 53*  --   --   GLUCOSE 169* 94 74     Imaging/Diagnostic Tests: No results found.  Leland Her, DO 02/17/2016, 9:41 AM PGY-1, [facility] FPTS Intern pager: [phone], text pages welcome

## 2016-02-18 ENCOUNTER — Encounter: Payer: Self-pay | Admitting: Internal Medicine

## 2016-02-18 ENCOUNTER — Non-Acute Institutional Stay (SKILLED_NURSING_FACILITY): Payer: Medicare Other | Admitting: Internal Medicine

## 2016-02-18 DIAGNOSIS — R531 Weakness: Secondary | ICD-10-CM

## 2016-02-18 DIAGNOSIS — I10 Essential (primary) hypertension: Secondary | ICD-10-CM | POA: Diagnosis not present

## 2016-02-18 DIAGNOSIS — D638 Anemia in other chronic diseases classified elsewhere: Secondary | ICD-10-CM | POA: Diagnosis not present

## 2016-02-18 DIAGNOSIS — E870 Hyperosmolality and hypernatremia: Secondary | ICD-10-CM

## 2016-02-18 DIAGNOSIS — E039 Hypothyroidism, unspecified: Secondary | ICD-10-CM | POA: Diagnosis not present

## 2016-02-18 DIAGNOSIS — L03116 Cellulitis of left lower limb: Secondary | ICD-10-CM | POA: Diagnosis not present

## 2016-02-18 DIAGNOSIS — E43 Unspecified severe protein-calorie malnutrition: Secondary | ICD-10-CM | POA: Diagnosis not present

## 2016-02-18 DIAGNOSIS — E785 Hyperlipidemia, unspecified: Secondary | ICD-10-CM

## 2016-02-18 DIAGNOSIS — F0280 Dementia in other diseases classified elsewhere without behavioral disturbance: Secondary | ICD-10-CM

## 2016-02-18 DIAGNOSIS — E876 Hypokalemia: Secondary | ICD-10-CM

## 2016-02-18 DIAGNOSIS — G309 Alzheimer's disease, unspecified: Secondary | ICD-10-CM | POA: Diagnosis not present

## 2016-02-18 NOTE — Progress Notes (Signed)
LOCATION: [facility]  PCP: No primary care provider on file.   Code Status: Full Code  Goals of care: Advanced Directive information Advanced Directives 02/15/2016  Does Patient Have a Medical Advance Directive? Yes  Type of Control and instrumentation engineer Information Primary Emergency Contact: Debo,Teresa Address: [address]          [place], [place] [address] [place] Home Phone: [phone] Relation: Daughter   No Known Allergies  Chief Complaint  Patient presents with  . New Admit To SNF    New Admission Visit      HPI:  Patient is a 83 y.o. female seen today for short term rehabilitation post hospital admission from 13th of January 2018-02/17/2016 with acute delirium secondary to left leg cellulitis and dehydration. She was started on antibiotic. CAT scan of the head was negative for acute abnormality. She has medical history of hypertension, hypothyroidism, CVA, hyperlipidemia among others. She is seen in her room today.  Review of Systems:  Constitutional: Negative for fever, chills, diaphoresis.  HENT: Negative for headache, nasal discharge, sore throat, difficulty swallowing.  Respiratory: Negative for shortness of breath and wheezing. Positive for occasional cough.    Cardiovascular: Negative for chest pain, palpitations, leg swelling.  Gastrointestinal: Negative for heartburn, nausea, vomiting, abdominal pain. She does not remember her last bowel movement Genitourinary: Negative for dysuria Musculoskeletal: Negative for back pain, fall in the facility.  Skin: Negative for itching, rash.  Neurological: Negative for dizziness. Psychiatric/Behavioral: Negative for depression   Past Medical History:  Diagnosis Date  . Anemia   . CKD (chronic kidney disease)   . Dementia   . Diastolic CHF (HCC)    History of severe LV dysfunction with LVEF 20%, resolved with LVEF 65% 11/2010  . GI  bleeding   . Glaucoma   . High cholesterol   . Hypertension   . Hypothyroidism   . MS (multiple sclerosis) (HCC)   . Stroke [facility])    Past Surgical History:  Procedure Laterality Date  . CATARACT EXTRACTION, BILATERAL    . COLECTOMY     partial with anastamosis   Social History:   reports that she has never smoked. She has never used smokeless tobacco. She reports that she does not drink alcohol or use drugs.  Family History  Problem Relation Age of Onset  . Stroke Mother   . Stroke Father     Medications: Allergies as of 02/18/2016   No Known Allergies     Medication List       Accurate as of 02/18/16 12:48 PM. Always use your most recent med list.          aspirin EC 81 MG tablet Take 81 mg by mouth daily.   CALCIUM 600+D3 PO Take 1 tablet by mouth daily.   carvedilol 25 MG tablet Commonly known as:  COREG Take 25 mg by mouth 2 (two) times daily with a meal.   cephALEXin 500 MG capsule Commonly known as:  KEFLEX Take 1 capsule (500 mg total) by mouth every 12 (twelve) hours.   ferrous sulfate 324 (65 Fe) MG Tbec Take 1 tablet by mouth daily.   hydrALAZINE 50 MG tablet Commonly known as:  APRESOLINE Take 50 mg by mouth 3 (three) times daily.   levothyroxine 25 MCG tablet Commonly known as:  SYNTHROID, LEVOTHROID Take 25 mcg by mouth daily before breakfast.   pravastatin 80 MG tablet  Commonly known as:  PRAVACHOL Take 80 mg by mouth at bedtime.   UNABLE TO FIND Med Name: Med pass 120 mL by mouth 3 times daily between meals   vitamin B-12 500 MCG tablet Commonly known as:  CYANOCOBALAMIN Take 500 mcg by mouth daily.       Immunizations:  There is no immunization history on file for this patient.   Physical Exam: Vitals:   02/18/16 1148  BP: (!) 143/43  Pulse: 82  Resp: 16  Temp: 98.6 F (37 C)  TempSrc: Oral  SpO2: 92%  Weight: 96 lb (43.5 kg)  Height: 4\' 11"  (1.499 m)   Body mass index is 19.39 kg/m.  General- elderly  female, Frail, thin built, in no acute distress Head- normocephalic, atraumatic Nose- no maxillary or frontal sinus tenderness, no nasal discharge Throat- moist mucus membrane, missing teeth Eyes- PERRLA, EOMI, no pallor, no icterus, no discharge, normal conjunctiva, normal sclera Neck- no cervical lymphadenopathy Cardiovascular- normal s1,s2, no murmur Respiratory- bilateral clear to auscultation, no wheeze, no rhonchi, no crackles, no use of accessory muscles Abdomen- bowel sounds present, soft, non tender Musculoskeletal- able to move all 4 extremities, generalized weakness, no leg edema Neurological- alert and oriented to self only Skin- warm and dry, dressing to both legs Psychiatry- normal mood and affect    Labs reviewed: Basic Metabolic Panel:  Recent Labs  43/56/86 1734 02/16/16 1107 02/17/16 0748  NA 142 145 147*  K 3.6 3.3* 3.4*  CL 106 112* 117*  CO2 24 25 24   GLUCOSE 169* 94 74  BUN 73* 64* 47*  CREATININE 1.80* 1.49* 1.34*  CALCIUM 9.1 8.6* 8.5*   Liver Function Tests:  Recent Labs  02/15/16 1734  AST 53*  ALT 39  ALKPHOS 52  BILITOT 0.4  PROT 6.8  ALBUMIN 3.0*   No results for input(s): LIPASE, AMYLASE in the last 8760 hours. No results for input(s): AMMONIA in the last 8760 hours. CBC:  Recent Labs  02/15/16 1734 02/16/16 1107  WBC 5.0 4.4  NEUTROABS 3.6  --   HGB 10.7* 9.0*  HCT 32.1* 27.7*  MCV 91.2 92.6  PLT 170 154   Cardiac Enzymes: No results for input(s): CKTOTAL, CKMB, CKMBINDEX, TROPONINI in the last 8760 hours. BNP: Invalid input(s): POCBNP CBG: No results for input(s): GLUCAP in the last 8760 hours.  Radiological Exams: Dg Chest 2 View  Result Date: 02/15/2016 CLINICAL DATA:  Pt unable to give history and aloc. Per ED Notes; Patient here with daughter in which patient resides. Patient here with altered loc, increased sleeping and bilateral ankle swelling the past week. No work of breathing noted, will open eyes and speak  with verbal stimulation. Daughter also reports new wound to left posterior hip. afebrile EXAM: CHEST  2 VIEW COMPARISON:  None. FINDINGS: Cardiac silhouette is mildly enlarged. No mediastinal or hilar masses and no evidence of adenopathy. Clear lungs.  No pleural effusion or pneumothorax. Skeletal structures are demineralized but grossly intact. IMPRESSION: No active cardiopulmonary disease. Electronically Signed   By: Amie Portland M.D.   On: 02/15/2016 17:50   Ct Head Wo Contrast  Result Date: 02/15/2016 CLINICAL DATA:  Altered mental status, increased sleeping and BILATERAL ankle swelling over past week, history CHF, hypertension, multiple sclerosis EXAM: CT HEAD WITHOUT CONTRAST TECHNIQUE: Contiguous axial images were obtained from the base of the skull through the vertex without intravenous contrast. COMPARISON:  None FINDINGS: Brain: Generalized atrophy. Normal ventricular morphology. No midline shift or mass effect. Small  vessel chronic ischemic changes of deep cerebral white matter. No intracranial hemorrhage, mass lesion, evidence of acute infarction, or extra-axial fluid collection. Vascular: Unremarkable Skull: Intact Sinuses/Orbits: Clear Other: N/A IMPRESSION: Atrophy with small vessel chronic ischemic changes of deep cerebral white matter. No acute intracranial abnormalities. Electronically Signed   By: Ulyses Southward M.D.   On: 02/15/2016 22:51    Assessment/Plan  Generalized weakness From physical deconditioning. Will need for her to work with physical therapy and occupational therapy to help regain her strength and balance. Possible long-term care resident. Fall precautions to be taken.  Left leg cellulitis Improved. Continue Keflex 500 mg every 12 hours until 02/23/2016. Monitor clinically. Provide skin care.   Hypernatremia Monitor BMP, hydration to be encouraged  Hypokalemia Monitor BMP and magnesium level  Anemia of chronic disease Monitor H&H. Continue ferrous sulfate 325 mg  daily and vitamin B12 supplement  Severe protein calorie malnutrition Registered dietitian to be consulted. Continue med Pass supplements. Check weight on a weekly basis for now.   Alzheimer's disease Provide supportive care. Fall precautions and pressure support phylaxis to be taken registered dietitian to evaluate the patient for severe protein calorie malnutrition. Declined anticipated. Assistance with meals, aspiration precautions and SLP evaluation  Acute renal impairment Avoid nephrotoxins and check bmp  Hypertension Continue Coreg 25 mg twice a day, hydralazine 50 mg 3 times a day and monitor blood pressure reading. Continue baby aspirin daily.  Hypothyroidism Continue levothyroxine current regimen, no changes made  Hyperlipidemia Continue pravastatin 80 mg daily    Goals of care: short term rehabilitation   Labs/tests ordered: ABC, CMP, mag 02/24/2016  Family/ staff Communication: reviewed care plan with patient and nursing supervisor    Oneal Grout, MD Internal Medicine [facility] [address] [place], [place] [address] Cell Phone (Monday-Friday 8 am - 5 pm): [phone] On Call: [phone] and follow prompts after 5 pm and on weekends Office Phone: [phone] Office Fax: [phone]

## 2016-02-21 LAB — CULTURE, BLOOD (ROUTINE X 2)
CULTURE: NO GROWTH
Culture: NO GROWTH

## 2016-03-04 ENCOUNTER — Non-Acute Institutional Stay (SKILLED_NURSING_FACILITY): Payer: Medicare Other | Admitting: Adult Health

## 2016-03-04 ENCOUNTER — Encounter: Payer: Self-pay | Admitting: Adult Health

## 2016-03-04 DIAGNOSIS — I10 Essential (primary) hypertension: Secondary | ICD-10-CM | POA: Diagnosis not present

## 2016-03-04 DIAGNOSIS — E039 Hypothyroidism, unspecified: Secondary | ICD-10-CM

## 2016-03-04 DIAGNOSIS — L03116 Cellulitis of left lower limb: Secondary | ICD-10-CM | POA: Diagnosis not present

## 2016-03-04 DIAGNOSIS — F0280 Dementia in other diseases classified elsewhere without behavioral disturbance: Secondary | ICD-10-CM | POA: Diagnosis not present

## 2016-03-04 DIAGNOSIS — D638 Anemia in other chronic diseases classified elsewhere: Secondary | ICD-10-CM

## 2016-03-04 DIAGNOSIS — E785 Hyperlipidemia, unspecified: Secondary | ICD-10-CM

## 2016-03-04 DIAGNOSIS — E43 Unspecified severe protein-calorie malnutrition: Secondary | ICD-10-CM

## 2016-03-04 DIAGNOSIS — G309 Alzheimer's disease, unspecified: Secondary | ICD-10-CM | POA: Diagnosis not present

## 2016-03-04 NOTE — Progress Notes (Signed)
DATE:  03/04/2016   MRN:  [id number]  BIRTHDAY: 11-11-1926  Facility:  Nursing Home Location:  [facility]  Nursing Home Room Number: 1002-A  LEVEL OF CARE:  SNF (31)  Contact Information    Name Relation Home Work Mobile   Jeppsen,Teresa Daughter [phone]         Code Status History    Date Active Date Inactive Code Status Order ID Comments User Context   02/16/2016 10:36 AM 02/17/2016 10:38 PM Full Code [id number]  Leland Her, DO Inpatient       Chief Complaint  Patient presents with  . Discharge Note    HISTORY OF PRESENT ILLNESS:  This is an 83 year old female who is for discharge home with Home health CNA and Nursing.  She has been admitted to [facility] on 02/1516 following an admission to [facility] from 02/15/16-02/17/16 with acute delirium secondary to left leg cellulitis and dehydration. She was given antibiotics. CAT scan of the head was negative for acute abnormality.  Patient was admitted to this facility for short-term rehabilitation after the patient's recent hospitalization.  Patient has completed SNF rehabilitation and therapy has cleared the patient for discharge.    PAST MEDICAL HISTORY:  Past Medical History:  Diagnosis Date  . Anemia   . CKD (chronic kidney disease)   . Dementia   . Diastolic CHF (HCC)    History of severe LV dysfunction with LVEF 20%, resolved with LVEF 65% 11/2010  . GI bleeding   . Glaucoma   . High cholesterol   . Hypertension   . Hypothyroidism   . MS (multiple sclerosis) (HCC)   . Stroke [facility])      CURRENT MEDICATIONS: Reviewed  Patient's Medications  New Prescriptions   No medications on file  Previous Medications   ASPIRIN EC 81 MG TABLET    Take 81 mg by mouth daily.   ATORVASTATIN (LIPITOR) 10 MG TABLET    Take 10 mg by mouth at bedtime.   CALCIUM CARB-CHOLECALCIFEROL (CALCIUM 600+D3 PO)    Take 1 tablet by mouth daily.   CARVEDILOL (COREG) 25 MG TABLET    Take 25 mg by  mouth 2 (two) times daily with a meal.    FERROUS SULFATE 324 (65 FE) MG TBEC    Take 1 tablet by mouth daily.   HYDRALAZINE (APRESOLINE) 50 MG TABLET    Take 50 mg by mouth 3 (three) times daily.   LEVOTHYROXINE (SYNTHROID, LEVOTHROID) 25 MCG TABLET    Take 25 mcg by mouth daily before breakfast.    UNABLE TO FIND    Med Name: Med pass 120 mL by mouth 3 times daily between meals   VITAMIN B-12 (CYANOCOBALAMIN) 500 MCG TABLET    Take 500 mcg by mouth daily.  Modified Medications   No medications on file  Discontinued Medications   PRAVASTATIN (PRAVACHOL) 80 MG TABLET    Take 80 mg by mouth at bedtime.     No Known Allergies   REVIEW OF SYSTEMS:  GENERAL: no change in appetite, no fatigue, no weight changes, no fever, chills or weakness EYES: Denies change in vision, dry eyes, eye pain, itching or discharge EARS: Denies change in hearing, ringing in ears, or earache NOSE: Denies nasal congestion or epistaxis MOUTH and THROAT: Denies oral discomfort, gingival pain or bleeding, pain from teeth or hoarseness   RESPIRATORY: no cough, SOB, DOE, wheezing, hemoptysis CARDIAC: no chest pain, edema or palpitations GI:  no abdominal pain, diarrhea, constipation, heart burn, nausea or vomiting GU: Denies dysuria, frequency, hematuria, incontinence, or discharge PSYCHIATRIC: Denies feeling of depression or anxiety. No report of hallucinations, insomnia, paranoia, or agitation    PHYSICAL EXAMINATION  GENERAL APPEARANCE:  In no acute distress. Normal body habitus SKIN:  Bilateral posterior knee has wound, dry, no erythema HEAD: Normal in size and contour. No evidence of trauma EYES: Lids open and close normally. No blepharitis, entropion or ectropion. PERRL. Conjunctivae are clear and sclerae are white. Lenses are without opacity EARS: Pinnae are normal. Patient hears normal voice tunes of the examiner MOUTH and THROAT: Lips are without lesions. Oral mucosa is moist and without lesions. Tongue  is normal in shape, size, and color and without lesions NECK: supple, trachea midline, no neck masses, no thyroid tenderness, no thyromegaly LYMPHATICS: no LAN in the neck, no supraclavicular LAN RESPIRATORY: breathing is even & unlabored, BS CTAB CARDIAC: RRR, no murmur,no extra heart sounds, no edema GI: abdomen soft, normal BS, no masses, no tenderness, no hepatomegaly, no splenomegaly EXTREMITIES:  Able to move 4 extremities PSYCHIATRIC: Alert to self, disoriented to time and place. Affect and behavior are appropriate  LABS/RADIOLOGY: Labs reviewed: Basic Metabolic Panel:  Recent Labs  41/32/44 1734 02/16/16 1107 02/17/16 0748  NA 142 145 147*  K 3.6 3.3* 3.4*  CL 106 112* 117*  CO2 24 25 24   GLUCOSE 169* 94 74  BUN 73* 64* 47*  CREATININE 1.80* 1.49* 1.34*  CALCIUM 9.1 8.6* 8.5*   Liver Function Tests:  Recent Labs  02/15/16 1734  AST 53*  ALT 39  ALKPHOS 52  BILITOT 0.4  PROT 6.8  ALBUMIN 3.0*    CBC:  Recent Labs  02/15/16 1734 02/16/16 1107  WBC 5.0 4.4  NEUTROABS 3.6  --   HGB 10.7* 9.0*  HCT 32.1* 27.7*  MCV 91.2 92.6  PLT 170 154     Dg Chest 2 View  Result Date: 02/15/2016 CLINICAL DATA:  Pt unable to give history and aloc. Per ED Notes; Patient here with daughter in which patient resides. Patient here with altered loc, increased sleeping and bilateral ankle swelling the past week. No work of breathing noted, will open eyes and speak with verbal stimulation. Daughter also reports new wound to left posterior hip. afebrile EXAM: CHEST  2 VIEW COMPARISON:  None. FINDINGS: Cardiac silhouette is mildly enlarged. No mediastinal or hilar masses and no evidence of adenopathy. Clear lungs.  No pleural effusion or pneumothorax. Skeletal structures are demineralized but grossly intact. IMPRESSION: No active cardiopulmonary disease. Electronically Signed   By: Amie Portland M.D.   On: 02/15/2016 17:50   Ct Head Wo Contrast  Result Date:  02/15/2016 CLINICAL DATA:  Altered mental status, increased sleeping and BILATERAL ankle swelling over past week, history CHF, hypertension, multiple sclerosis EXAM: CT HEAD WITHOUT CONTRAST TECHNIQUE: Contiguous axial images were obtained from the base of the skull through the vertex without intravenous contrast. COMPARISON:  None FINDINGS: Brain: Generalized atrophy. Normal ventricular morphology. No midline shift or mass effect. Small vessel chronic ischemic changes of deep cerebral white matter. No intracranial hemorrhage, mass lesion, evidence of acute infarction, or extra-axial fluid collection. Vascular: Unremarkable Skull: Intact Sinuses/Orbits: Clear Other: N/A IMPRESSION: Atrophy with small vessel chronic ischemic changes of deep cerebral white matter. No acute intracranial abnormalities. Electronically Signed   By: Ulyses Southward M.D.   On: 02/15/2016 22:51    ASSESSMENT/PLAN:  Left leg cellulitis - completed course of Keflex; resolved;  keep skin clean and dry; monitor for redness/open wounds  Anemia of chronic disease - stable; continue ferrous sulfate 324 mg 1 tab by mouth daily Lab Results  Component Value Date   HGB 9.0 (L) 02/16/2016   Hypertension - well-controlled; continue Coreg 25 mg 1 tab by mouth twice a day and hydralazine 50 mg 1 tab by mouth 3 times a day  Hypothyroidism - continue Synthroid 25 g 1 tab by mouth daily  Hyperlipidemia - continue Lipitor 10 mg 1 tab by mouth daily at bedtime  Alzheimer's disease - continue supportive care; fall precautions  Protein calorie malnutrition, severe - continue med Pass 120 mL by mouth 3 times a day   I have filled out patient's discharge paperwork and written prescriptions.  Patient will receive home health Nursing and CNA.  DME provided:  Standard wheelchair with leg rests, brake extensions, anti-tippers and cushion  Total discharge time: Greater than 30 minutes Greater than 50% was been in counseling and coordination of  care with the patient.   Discharge time involved coordination of the discharge process with social worker, nursing staff and therapy department. Medical justification for home health services/DME verified.     Kenard Gower, NP [employer] [phone]

## 2016-03-16 ENCOUNTER — Emergency Department (HOSPITAL_COMMUNITY): Payer: Medicare Other

## 2016-03-16 ENCOUNTER — Inpatient Hospital Stay (HOSPITAL_COMMUNITY): Payer: Medicare Other

## 2016-03-16 ENCOUNTER — Inpatient Hospital Stay (HOSPITAL_COMMUNITY)
Admission: EM | Admit: 2016-03-16 | Discharge: 2016-03-18 | DRG: 064 | Disposition: A | Discharge status: Hospice | Payer: Medicare Other | Attending: Family Medicine | Admitting: Family Medicine

## 2016-03-16 ENCOUNTER — Other Ambulatory Visit (HOSPITAL_COMMUNITY): Payer: Self-pay | Admitting: Radiology

## 2016-03-16 ENCOUNTER — Encounter (HOSPITAL_COMMUNITY): Payer: Self-pay | Admitting: Emergency Medicine

## 2016-03-16 DIAGNOSIS — I5032 Chronic diastolic (congestive) heart failure: Secondary | ICD-10-CM | POA: Diagnosis present

## 2016-03-16 DIAGNOSIS — L89220 Pressure ulcer of left hip, unstageable: Secondary | ICD-10-CM | POA: Diagnosis not present

## 2016-03-16 DIAGNOSIS — L89223 Pressure ulcer of left hip, stage 3: Secondary | ICD-10-CM | POA: Diagnosis present

## 2016-03-16 DIAGNOSIS — R509 Fever, unspecified: Secondary | ICD-10-CM | POA: Diagnosis not present

## 2016-03-16 DIAGNOSIS — Z79899 Other long term (current) drug therapy: Secondary | ICD-10-CM | POA: Diagnosis not present

## 2016-03-16 DIAGNOSIS — I10 Essential (primary) hypertension: Secondary | ICD-10-CM | POA: Diagnosis not present

## 2016-03-16 DIAGNOSIS — H409 Unspecified glaucoma: Secondary | ICD-10-CM | POA: Diagnosis present

## 2016-03-16 DIAGNOSIS — L89321 Pressure ulcer of left buttock, stage 1: Secondary | ICD-10-CM | POA: Diagnosis present

## 2016-03-16 DIAGNOSIS — I161 Hypertensive emergency: Secondary | ICD-10-CM | POA: Diagnosis present

## 2016-03-16 DIAGNOSIS — G919 Hydrocephalus, unspecified: Secondary | ICD-10-CM | POA: Diagnosis present

## 2016-03-16 DIAGNOSIS — F0280 Dementia in other diseases classified elsewhere without behavioral disturbance: Secondary | ICD-10-CM | POA: Diagnosis present

## 2016-03-16 DIAGNOSIS — I671 Cerebral aneurysm, nonruptured: Secondary | ICD-10-CM

## 2016-03-16 DIAGNOSIS — E039 Hypothyroidism, unspecified: Secondary | ICD-10-CM | POA: Diagnosis present

## 2016-03-16 DIAGNOSIS — G309 Alzheimer's disease, unspecified: Secondary | ICD-10-CM | POA: Diagnosis present

## 2016-03-16 DIAGNOSIS — G9340 Encephalopathy, unspecified: Secondary | ICD-10-CM | POA: Insufficient documentation

## 2016-03-16 DIAGNOSIS — I615 Nontraumatic intracerebral hemorrhage, intraventricular: Secondary | ICD-10-CM | POA: Diagnosis present

## 2016-03-16 DIAGNOSIS — E46 Unspecified protein-calorie malnutrition: Secondary | ICD-10-CM | POA: Diagnosis present

## 2016-03-16 DIAGNOSIS — L89892 Pressure ulcer of other site, stage 2: Secondary | ICD-10-CM | POA: Diagnosis present

## 2016-03-16 DIAGNOSIS — Z66 Do not resuscitate: Secondary | ICD-10-CM | POA: Diagnosis present

## 2016-03-16 DIAGNOSIS — I130 Hypertensive heart and chronic kidney disease with heart failure and stage 1 through stage 4 chronic kidney disease, or unspecified chronic kidney disease: Secondary | ICD-10-CM | POA: Diagnosis present

## 2016-03-16 DIAGNOSIS — Z8673 Personal history of transient ischemic attack (TIA), and cerebral infarction without residual deficits: Secondary | ICD-10-CM

## 2016-03-16 DIAGNOSIS — N189 Chronic kidney disease, unspecified: Secondary | ICD-10-CM | POA: Diagnosis present

## 2016-03-16 DIAGNOSIS — Z515 Encounter for palliative care: Secondary | ICD-10-CM | POA: Diagnosis not present

## 2016-03-16 DIAGNOSIS — Z9842 Cataract extraction status, left eye: Secondary | ICD-10-CM

## 2016-03-16 DIAGNOSIS — Z681 Body mass index (BMI) 19 or less, adult: Secondary | ICD-10-CM

## 2016-03-16 DIAGNOSIS — R4182 Altered mental status, unspecified: Secondary | ICD-10-CM

## 2016-03-16 DIAGNOSIS — R58 Hemorrhage, not elsewhere classified: Secondary | ICD-10-CM

## 2016-03-16 DIAGNOSIS — G35 Multiple sclerosis: Secondary | ICD-10-CM | POA: Diagnosis present

## 2016-03-16 DIAGNOSIS — D649 Anemia, unspecified: Secondary | ICD-10-CM | POA: Diagnosis present

## 2016-03-16 DIAGNOSIS — E7800 Pure hypercholesterolemia, unspecified: Secondary | ICD-10-CM | POA: Diagnosis present

## 2016-03-16 DIAGNOSIS — M869 Osteomyelitis, unspecified: Secondary | ICD-10-CM | POA: Diagnosis present

## 2016-03-16 DIAGNOSIS — Z7982 Long term (current) use of aspirin: Secondary | ICD-10-CM

## 2016-03-16 DIAGNOSIS — R40243 Glasgow coma scale score 3-8, unspecified time: Secondary | ICD-10-CM | POA: Diagnosis present

## 2016-03-16 DIAGNOSIS — Z9841 Cataract extraction status, right eye: Secondary | ICD-10-CM

## 2016-03-16 LAB — CBC WITH DIFFERENTIAL/PLATELET
Basophils Absolute: 0 10*3/uL (ref 0.0–0.1)
Basophils Relative: 0 %
EOS PCT: 0 %
Eosinophils Absolute: 0 10*3/uL (ref 0.0–0.7)
HCT: 29.9 % — ABNORMAL LOW (ref 36.0–46.0)
HEMOGLOBIN: 9.4 g/dL — AB (ref 12.0–15.0)
LYMPHS ABS: 0.6 10*3/uL — AB (ref 0.7–4.0)
LYMPHS PCT: 6 %
MCH: 29.2 pg (ref 26.0–34.0)
MCHC: 31.4 g/dL (ref 30.0–36.0)
MCV: 92.9 fL (ref 78.0–100.0)
MONOS PCT: 3 %
Monocytes Absolute: 0.3 10*3/uL (ref 0.1–1.0)
Neutro Abs: 8.4 10*3/uL — ABNORMAL HIGH (ref 1.7–7.7)
Neutrophils Relative %: 91 %
PLATELETS: 179 10*3/uL (ref 150–400)
RBC: 3.22 MIL/uL — AB (ref 3.87–5.11)
RDW: 14.4 % (ref 11.5–15.5)
WBC: 9.2 10*3/uL (ref 4.0–10.5)

## 2016-03-16 LAB — URINALYSIS, ROUTINE W REFLEX MICROSCOPIC
Bacteria, UA: NONE SEEN
Bilirubin Urine: NEGATIVE
GLUCOSE, UA: NEGATIVE mg/dL
Hgb urine dipstick: NEGATIVE
Ketones, ur: NEGATIVE mg/dL
Leukocytes, UA: NEGATIVE
Nitrite: NEGATIVE
PH: 5 (ref 5.0–8.0)
PROTEIN: 100 mg/dL — AB
Specific Gravity, Urine: 1.019 (ref 1.005–1.030)

## 2016-03-16 LAB — COMPREHENSIVE METABOLIC PANEL
ALK PHOS: 33 U/L — AB (ref 38–126)
ALT: 12 U/L — AB (ref 14–54)
AST: 15 U/L (ref 15–41)
Albumin: 2.8 g/dL — ABNORMAL LOW (ref 3.5–5.0)
Anion gap: 9 (ref 5–15)
BUN: 33 mg/dL — ABNORMAL HIGH (ref 6–20)
CALCIUM: 9 mg/dL (ref 8.9–10.3)
CO2: 26 mmol/L (ref 22–32)
CREATININE: 1.22 mg/dL — AB (ref 0.44–1.00)
Chloride: 111 mmol/L (ref 101–111)
GFR calc Af Amer: 45 mL/min — ABNORMAL LOW (ref >60)
GFR, EST NON AFRICAN AMERICAN: 39 mL/min — AB (ref >60)
Glucose, Bld: 192 mg/dL — ABNORMAL HIGH (ref 65–99)
Potassium: 3.8 mmol/L (ref 3.5–5.1)
Sodium: 146 mmol/L — ABNORMAL HIGH (ref 135–145)
Total Bilirubin: 0.6 mg/dL (ref 0.3–1.2)
Total Protein: 6.6 g/dL (ref 6.5–8.1)

## 2016-03-16 LAB — INFLUENZA PANEL BY PCR (TYPE A & B)
INFLAPCR: NEGATIVE
INFLBPCR: NEGATIVE

## 2016-03-16 LAB — I-STAT TROPONIN, ED: TROPONIN I, POC: 0.07 ng/mL (ref 0.00–0.08)

## 2016-03-16 LAB — I-STAT CHEM 8, ED
BUN: 33 mg/dL — ABNORMAL HIGH (ref 6–20)
CALCIUM ION: 1.17 mmol/L (ref 1.15–1.40)
CREATININE: 1.3 mg/dL — AB (ref 0.44–1.00)
Chloride: 110 mmol/L (ref 101–111)
GLUCOSE: 189 mg/dL — AB (ref 65–99)
HCT: 28 % — ABNORMAL LOW (ref 36.0–46.0)
HEMOGLOBIN: 9.5 g/dL — AB (ref 12.0–15.0)
Potassium: 3.7 mmol/L (ref 3.5–5.1)
Sodium: 147 mmol/L — ABNORMAL HIGH (ref 135–145)
TCO2: 26 mmol/L (ref 0–100)

## 2016-03-16 LAB — POC OCCULT BLOOD, ED: Fecal Occult Bld: NEGATIVE

## 2016-03-16 LAB — I-STAT CG4 LACTIC ACID, ED: Lactic Acid, Venous: 1.19 mmol/L (ref 0.5–1.9)

## 2016-03-16 LAB — TSH: TSH: 1.422 u[IU]/mL (ref 0.350–4.500)

## 2016-03-16 MED ORDER — VANCOMYCIN HCL IN DEXTROSE 1-5 GM/200ML-% IV SOLN
1000.0000 mg | Freq: Once | INTRAVENOUS | Status: AC
  Administered 2016-03-16: 1000 mg via INTRAVENOUS
  Filled 2016-03-16: qty 200

## 2016-03-16 MED ORDER — HYDRALAZINE HCL 20 MG/ML IJ SOLN
10.0000 mg | INTRAMUSCULAR | Status: DC | PRN
  Administered 2016-03-16: 10 mg via INTRAVENOUS
  Filled 2016-03-16: qty 1

## 2016-03-16 MED ORDER — HYDRALAZINE HCL 20 MG/ML IJ SOLN
10.0000 mg | INTRAMUSCULAR | Status: DC | PRN
  Administered 2016-03-16: 10 mg via INTRAVENOUS

## 2016-03-16 MED ORDER — ACETAMINOPHEN 650 MG RE SUPP: 650.0000 mg | Freq: Four times a day (QID) | RECTAL | Status: DC | PRN

## 2016-03-16 MED ORDER — PIPERACILLIN-TAZOBACTAM 3.375 G IVPB 30 MIN
3.3750 g | Freq: Once | INTRAVENOUS | Status: AC
  Administered 2016-03-16: 3.375 g via INTRAVENOUS
  Filled 2016-03-16: qty 50

## 2016-03-16 MED ORDER — ONDANSETRON HCL 4 MG/2ML IJ SOLN: 4.0000 mg | Freq: Four times a day (QID) | INTRAMUSCULAR | Status: DC | PRN

## 2016-03-16 MED ORDER — ONDANSETRON 4 MG PO TBDP: 4.0000 mg | ORAL_TABLET | Freq: Four times a day (QID) | ORAL | Status: DC | PRN

## 2016-03-16 MED ORDER — ACETAMINOPHEN 325 MG PO TABS: 650.0000 mg | ORAL_TABLET | Freq: Four times a day (QID) | ORAL | Status: DC | PRN

## 2016-03-16 MED ORDER — HYDRALAZINE HCL 50 MG PO TABS
50.0000 mg | ORAL_TABLET | Freq: Three times a day (TID) | ORAL | Status: DC
  Administered 2016-03-16: 50 mg via ORAL
  Filled 2016-03-16: qty 1

## 2016-03-16 MED ORDER — CARVEDILOL 12.5 MG PO TABS
25.0000 mg | ORAL_TABLET | Freq: Two times a day (BID) | ORAL | Status: DC
  Administered 2016-03-16: 25 mg via ORAL
  Filled 2016-03-16: qty 2

## 2016-03-16 MED ORDER — PIPERACILLIN-TAZOBACTAM 3.375 G IVPB
3.3750 g | Freq: Three times a day (TID) | INTRAVENOUS | Status: DC
Start: 2016-03-16 — End: 2016-03-16

## 2016-03-16 MED ORDER — SODIUM CHLORIDE 0.9 % IV BOLUS (SEPSIS)
500.0000 mL | Freq: Once | INTRAVENOUS | Status: AC
  Administered 2016-03-16: 500 mL via INTRAVENOUS

## 2016-03-16 MED ORDER — NICARDIPINE HCL IN NACL 20-0.86 MG/200ML-% IV SOLN
3.0000 mg/h | INTRAVENOUS | Status: DC
  Administered 2016-03-16: 5 mg/h via INTRAVENOUS
  Filled 2016-03-16: qty 200

## 2016-03-16 MED ORDER — ACETAMINOPHEN 10 MG/ML IV SOLN
1000.0000 mg | Freq: Four times a day (QID) | INTRAVENOUS | Status: AC
  Administered 2016-03-16 – 2016-03-17 (×4): 1000 mg via INTRAVENOUS
  Filled 2016-03-16 (×6): qty 100

## 2016-03-16 MED ORDER — SODIUM CHLORIDE 0.9 % IV SOLN
Freq: Once | INTRAVENOUS | Status: AC
  Administered 2016-03-16: 12:00:00 via INTRAVENOUS

## 2016-03-16 MED ORDER — LEVOTHYROXINE SODIUM 100 MCG IV SOLR
12.5000 ug | Freq: Every day | INTRAVENOUS | Status: DC
  Administered 2016-03-16: 12.5 ug via INTRAVENOUS
  Filled 2016-03-16: qty 5

## 2016-03-16 MED ORDER — BISACODYL 10 MG RE SUPP: 10.0000 mg | Freq: Every day | RECTAL | Status: DC | PRN

## 2016-03-16 MED ORDER — LORAZEPAM 2 MG/ML IJ SOLN: 1.0000 mg | INTRAMUSCULAR | Status: DC | PRN

## 2016-03-16 MED ORDER — ACETAMINOPHEN 650 MG RE SUPP
650.0000 mg | Freq: Once | RECTAL | Status: AC
  Administered 2016-03-16: 650 mg via RECTAL
  Filled 2016-03-16: qty 1

## 2016-03-16 MED ORDER — HYDRALAZINE HCL 20 MG/ML IJ SOLN
2.0000 mg | INTRAMUSCULAR | Status: DC | PRN
  Filled 2016-03-16: qty 1

## 2016-03-16 MED ORDER — LABETALOL HCL 5 MG/ML IV SOLN
5.0000 mg | INTRAVENOUS | Status: DC | PRN
  Administered 2016-03-16: 5 mg via INTRAVENOUS
  Filled 2016-03-16: qty 4

## 2016-03-16 MED ORDER — MORPHINE SULFATE (PF) 4 MG/ML IV SOLN
1.0000 mg | INTRAVENOUS | Status: DC | PRN
  Administered 2016-03-16 – 2016-03-18 (×9): 1 mg via INTRAVENOUS
  Filled 2016-03-16 (×9): qty 1

## 2016-03-16 MED ORDER — VANCOMYCIN HCL 500 MG IV SOLR: 500.0000 mg | INTRAVENOUS | Status: DC

## 2016-03-16 MED ORDER — IOPAMIDOL (ISOVUE-370) INJECTION 76%
INTRAVENOUS | Status: AC
  Administered 2016-03-16: 50 mL
  Filled 2016-03-16: qty 50

## 2016-03-16 MED ORDER — DEXTROSE-NACL 5-0.45 % IV SOLN
INTRAVENOUS | Status: DC
  Administered 2016-03-16: 15:00:00 via INTRAVENOUS

## 2016-03-16 MED ORDER — SODIUM CHLORIDE 0.9% FLUSH
3.0000 mL | Freq: Two times a day (BID) | INTRAVENOUS | Status: DC
  Administered 2016-03-18: 3 mL via INTRAVENOUS

## 2016-03-16 MED ORDER — ACETAMINOPHEN 325 MG PO TABS: 650.0000 mg | ORAL_TABLET | Freq: Once | ORAL | Status: DC

## 2016-03-16 NOTE — ED Notes (Signed)
Pt has palm size wound on left hip that had foul smelling pus like drainage under opsite dressing, pt cleansed of blackish stool in adult diaper hemocult done and in and out cath done

## 2016-03-16 NOTE — Progress Notes (Signed)
Met with patient's daughter Helene Kelp and granddaughter. Kayla Li is approaching EOL. Decisions that need to be made are really about comfort at this point- given her very frail, fragile state of health to prolog her life and continue to use invasive diagnostics and interventions ifs prolonging her suffering.They agree that her comfort is very important to them as well as her dignity. I discussed hospice options. Waiting on another daughter to arrive. Will for now treat her pain with IV Tylenol scheduled PRN morphine and continue supportive care. I recommended a hospice facility for her care.   Would not use a Cardene drip. Treat pain and symptoms for now. Will await other guidance from family on how to proceed - I reassured them we would continue to care for their mother at a very high level and provide medical treatments that would support her comfort.  Scheduled IV Tylenol PRN Morphine Consider med surg bed -hopefully they weill decide about a hospice facility and we can assist with that care transition. DNR   Full consult note to follow.  Lane Hacker, DO Palliative Medicine

## 2016-03-16 NOTE — H&P (Signed)
Family Medicine Teaching [facility] Admission History and Physical Service Pager: [phone]  Patient name: Kayla Li Medical record number: [id number] Date of birth: 08/17/1926 Age: 83 y.o. Gender: female  Primary Care Provider: Evlyn Courier, MD Consultants: Palliative care Code Status: DNR  Chief Complaint: AMS  Assessment and Plan: [name] is a 83 y.o. female presenting with AMS found to have intraventricular hemorrhage . PMH is significant for HTN, HLD, HFpEF, MS, hypothyroidism distant CVA  AMS, found to have Intraventricular hemorrhage and possible hydrocephaly - Last known normal was yesterday evening. Patient difficult to assess on neuro exam - snoring, lethargic, opens eyes on command but difficult to wake. No posturing. Patient withdraws to pain in 3 extremities, has known residual LLE weakness and does not move LLE. CT head notable for ventricular hemorrhage (unknown age by density - may be subacute). Most likely etiology of intracerebral hemorrhage is uncontrolled HTN (204/55 - see separate problem).  AMS differential considered: hypothyroidism but TSH WNL, infectious process with fever to 101.6F but flu negative, no symptoms by history, CXR clear, skin exam WNL. Will send urine cultures. Seizure, ingestion, and end-stage MS disease initially considered but CT head confirmed hemorrhagic stroke as most likely etiology of AMS - Admit to FPTS, attending Dr. Randolm Idol, Stepdown - follow up urine cultures - cycle troponins (istat 0.07) - EKG now and repeat in AM - CTA head in process - palliative care consulted - to discuss goals of care with daughter  Hypertensive Emergency - sustained SBP >200 despite PRN hydralazine and labetolol, DBP 50s. Most likely cause of hemorrhagic stroke. Patient did not take BP meds this AM, per daughter's report she otherwise takes meds as prescribed. Curbsided neurosurgery (Dr. Jordan Likes) who agreed with a nicardipine gtt, goal SBP 160 (given  advanced age) to bring down BP (shown to have better functional outcomes with hemorrhagic stroke with rapid correction of BP). - holding home antihypertensives while on nicardipine gtt and NPO (coreg, enalapril) - monitor BP closely  Fever, 101.6F - in setting of acute intracranial process. Infection considered, asymptomatic. CXR clear. Pressure wounds do not appear infected. WBC 9.2 - flu negative - urine cultures pending - blood culture pending - vanc/zosyn for empiric coverage, readdress as cultures result - monitor fever curve  Generalized Weakness  Noted over last week after leaving SNF. Patient could not even sit up on her own.  - protein malnutrition noted on last admission, started on ensure emliv TID with meal. Can continue if patient passes swallow study - PT consult  HFpEF, stable. Euvolemic on admission. Last echo (03/15/12) EF>65% G1DD followed by [facility] cardiology. At home on coreg, enalapril, hydralazine, which were all held. - hold home meds while NPO and on cardipine gtt  Hypothyroidism: TSH 1.422 on admission - IV equivalent of home levothyroxine while NPO  Anemia, stable - Hgb 9.4 on admission with baseline ~10.5. At home on iron supplementation - hold home iron while NPO/with possible infection  H/o CVA - hold home ASA 81 and statin while NPO  H/o MS, stable - no home meds, unable to assess for focal deficits at admission  FEN/GI: NPO Prophylaxis: SCDs due to hemorrhagic stroke  Disposition: Will likely need SNF  History of Present Illness:   History was obtained from the patient's daughter at bedside because patient was not waking up and has dementia at baseline.    Kayla Li is a 83 y.o. female presenting with altered mental status. Patient was in her usual state of health until  this morning when she was noted to be less responsive, sleepy, not eating or responding well.  The patient was recently hospitalized in January for left LE cellulitis and  discharged to SNF. Since leaving SNF on Tuesday of this week, the patient has not been at her baseline with increased generalized weakness, unable to sit up independently. She was noted to have low-grade "fevers" (highest 100.2) at home by a health aide which was treated with tylenol, but no symptoms of dyspnea, cough, diarrhea, achiness, or changes in urination and mentation at baseline, cellulitis on leg seems to be improving and the antibiotics are complete.   The patient was answering questions and eating normally last night before bed.  This morning she was noted to be lethargic and less interactive.  The patient can answer simple questions at baseline but does not walk, she lives with her daughter who handles all of her care with aid of home health PT, OT.   Patient has a history of CVA several years ago by history with residual left leg weakness (though this may also be from known MS).  No known history of MI. No smoking history.  Review Of Systems: Per HPI with the following additions: level V caveat due to AMS  ROS  Patient Active Problem List   Diagnosis Date Noted  . Cellulitis of left lower extremity   . Somnolence   . Alzheimer disease   . Cellulitis 02/16/2016  . Dehydration   . AKI (acute kidney injury) (HCC) 02/15/2016    Past Medical History: Past Medical History:  Diagnosis Date  . Anemia   . CKD (chronic kidney disease)   . Dementia   . Diastolic CHF (HCC)    History of severe LV dysfunction with LVEF 20%, resolved with LVEF 65% 11/2010  . GI bleeding   . Glaucoma   . High cholesterol   . Hypertension   . Hypothyroidism   . MS (multiple sclerosis) (HCC)   . Stroke [facility])     Past Surgical History: Past Surgical History:  Procedure Laterality Date  . CATARACT EXTRACTION, BILATERAL    . COLECTOMY     partial with anastamosis    Social History: Social History  Substance Use Topics  . Smoking status: Never Smoker  . Smokeless tobacco: Never Used  .  Alcohol use No   Additional social history: lives with daughter Please also refer to relevant sections of EMR.  Family History: Family History  Problem Relation Age of Onset  . Stroke Mother   . Stroke Father     Allergies and Medications: No Known Allergies No current facility-administered medications on file prior to encounter.    Current Outpatient Prescriptions on File Prior to Encounter  Medication Sig Dispense Refill  . aspirin EC 81 MG tablet Take 81 mg by mouth daily.    Marland Kitchen atorvastatin (LIPITOR) 10 MG tablet Take 10 mg by mouth at bedtime.    . Calcium Carb-Cholecalciferol (CALCIUM 600+D3 PO) Take 1 tablet by mouth daily.    . carvedilol (COREG) 25 MG tablet Take 25 mg by mouth 2 (two) times daily with a meal.     . ferrous sulfate 324 (65 Fe) MG TBEC Take 1 tablet by mouth daily.    . hydrALAZINE (APRESOLINE) 50 MG tablet Take 50 mg by mouth 3 (three) times daily.    Marland Kitchen levothyroxine (SYNTHROID, LEVOTHROID) 25 MCG tablet Take 25 mcg by mouth daily before breakfast.     . vitamin B-12 (CYANOCOBALAMIN) 500 MCG  tablet Take 500 mcg by mouth daily.    Marland Kitchen UNABLE TO FIND Med Name: Med pass 120 mL by mouth 3 times daily between meals      Objective: BP (!) 210/45   Pulse 109   Temp 101.8 F (38.8 C) (Rectal)   Resp 21   SpO2 91%  Exam: General: lethargic, snoring, unable to arouse patient, not posturing, withdraws to pain in 3 extremities, not LLE (known residual old CVA weakness) Eyes: PERRL, no scleral icterus or conjunctival injection ENTM: poor dentition with black teeth, unable to view oropharynx Neck: no LAD Cardiovascular: +irregular rhythm, no murmers rubs or gallops Respiratory: +transmitted [place] sounds with snoring, no wheezes appreciated, no inspiratory effort on command Gastrointestinal: no tenderness to palpation, normoactive BS, nondistended MSK: No gross abnormalities, difficult to assess Derm: +stage 1 pressure ulcer noted over left gluteus, +stage two  ulcers over lateral left knee, medial right knee, +stage 3 pressure ulcer with bandage in place, minimal surrouding erythema and serous drainage on left hip, no purulent drainage, no warmth or edema. Neuro: moves RUE, LUE, LLE in withdrawal to pain. Winces to pain in RLE but does not withdraw. Pupils equally round and reactive to light. Does not respond to commands. No posturing. +Purposeful movement of right hand (pulling at lines) Psych: GCS 8 (E3, V1, M4)   Labs and Imaging: CBC BMET   Recent Labs Lab 03/16/16 1026 03/16/16 1038  WBC 9.2  --   HGB 9.4* 9.5*  HCT 29.9* 28.0*  PLT 179  --     Recent Labs Lab 03/16/16 1026 03/16/16 1038  NA 146* 147*  K 3.8 3.7  CL 111 110  CO2 26  --   BUN 33* 33*  CREATININE 1.22* 1.30*  GLUCOSE 192* 189*  CALCIUM 9.0  --      TSH 1.422 Troponin 0.07 Lactic Acid 1.19  Ct Angio Head W Or Wo Contrast  Addendum Date: 03/16/2016   ADDENDUM REPORT: 03/16/2016 16:44 ADDENDUM: Study discussed by telephone with Dr. Donnella Sham on 03/16/2016 at 1638 hours. We discussed that although subacute aneurysmal hemorrhage with redistribution into the ventricles could explain this clinical picture - intraventricular hemorrhage can also be seen in the elderly from relatively minor trauma such as fall from standing. Electronically Signed   By: Odessa Fleming M.D.   On: 03/16/2016 16:44   Result Date: 03/16/2016 CLINICAL DATA:  83 year old female with intraventricular hemorrhage discovered during evaluation of altered mental status. Initial encounter. EXAM: CT ANGIOGRAPHY HEAD TECHNIQUE: Multidetector CT imaging of the head was performed using the standard protocol during bolus administration of intravenous contrast. Multiplanar CT image reconstructions and MIPs were obtained to evaluate the vascular anatomy. CONTRAST:  50 mL Isovue 370 COMPARISON:  Head CT without contrast 1431 hours today, and 02/15/2016. FINDINGS: CTA HEAD Posterior circulation: Mildly dominant left  vertebral artery. The visible cervical vertebral arteries are tortuous but without stenosis. There is mild calcified plaque in the right V4 segment which does not appear hemodynamically significant. No distal left vertebral artery stenosis. Left PICA origin is patent. Right AICA appears dominant. The basilar artery is patent without stenosis. At the basilar tip there is an oval saccular aneurysm measuring 5-6 mm directed superiorly and posteriorly (series 407, image 21, series 402 image 115, series 404, image 104 and series 405, image 24). The anterior superior wall of the aneurysm is partially calcified. The tip of the aneurysm is mildly irregular. That aneurysm does incorporate the origins of both posterior cerebral arteries.  The SCA origins are less affected. Those origins remain patent. Posterior communicating arteries are diminutive or absent. The bilateral PCA P1 and P2 segments are mildly to moderately irregular. The left P1 segment has a beaded appearance (series 405, image 24). Up to moderate stenosis occurs bilaterally. However, there is preserved bilateral distal PCA flow. Anterior circulation: The visible carotid bifurcations are patent. There is no stenosis at the left carotid bifurcation. The cervical ICAs are tortuous, more so the right. Both ICA siphons are patent. There is mild for age bilateral siphon calcified plaque without stenosis. There is a subtle infundibulum of the distal right ICA. The right carotid terminus is normal. The ACA origins are within normal limits. The anterior communicating artery is diminutive or absent. There is mild to moderate ectasia of the proximal right A2 segment (series 406, image 21), but otherwise the bilateral ACA branches are within normal limits. The right MCA M1 segment is normal. The right MCA bifurcation is patent with mild ectasia but no discrete aneurysm (series 406, image 13). The right MCA branches are mildly irregular. Arising from the proximal left M1  segment posteriorly (series 403, images 100 and 101) there is a saccular but broad-based aneurysm encompassing 6 x 6 x 8 mm. The aneurysm is directed posteriorly and slightly superiorly. The tip of the aneurysm is mildly irregular (series 404, image 128). See series 407 image 21, series 402, images 108 an 109, and series 404 image 128. There is no associated stenosis of the left M1 segment. Distal to the aneurysm the left M1 is within normal limits. At the bifurcation there is mild irregularity and dolichoectasia of the proximal M2 branches with no other discrete left MCA aneurysm. Venous sinuses: Appear normal. Anatomic variants: None. Delayed phase: Stable intraventricular hemorrhage, more so in the right occipital horn than the left. No abnormal parenchymal enhancement. No other recent intracranial hemorrhage identified. IMPRESSION: 1. Positive for two intracranial aneurysms: Basilar tip (5-6 mm) and left MCA M1 segment (8 mm). The intraventricular hemorrhage, which is new since 02/15/2016, therefore raises the possibility of recent (subacute) aneurysm related subarachnoid hemorrhage which has now redistributed into the ventricles. Recommend Neuroendovascular and/or Neurosurgical consultation. 2. Other intracranial dolichoectasia but no other discrete intracranial aneurysm. Intracranial atherosclerosis but no hemodynamically carotid or vertebrobasilar stenosis. There is up to moderate bilateral PCA stenosis. 3. Stable intraventricular hemorrhage and CT appearance of the brain since 1431 hours today. Electronically Signed: By: Odessa Fleming M.D. On: 03/16/2016 16:30   Ct Head Wo Contrast  Result Date: 03/16/2016 CLINICAL DATA:  Altered mental status EXAM: CT HEAD WITHOUT CONTRAST TECHNIQUE: Contiguous axial images were obtained from the base of the skull through the vertex without intravenous contrast. COMPARISON:  CT head 02/15/2016 FINDINGS: Brain: Interval development of intraventricular hemorrhage. There is  blood layering in the occipital horn bilaterally right greater than left. The blood density is less than expected for acute blood and this may be subacute hemorrhage. No parenchymal hematoma identified. No underlying mass. Generalized atrophy. Ventricular enlargement is present slightly greater than that seen previously which is probably due to early hydrocephalus as well as atrophy. Chronic microvascular ischemic change in the white matter. No acute ischemic infarction. Vascular: Negative for hyperdense vessel. Skull: Negative Sinuses/Orbits: Mild mucosal edema in the ethmoid sinuses. Bilateral lens replacement. No orbital mass. Other: None IMPRESSION: Intraventricular hemorrhage with mild hydrocephalus. Based on the density of blood, this may be a subacute ventricular hemorrhage. This most likely is due to hypertension. Other possibilities would include  amyloid, trauma, neoplasm, and vascular malformation. Consider CTA head for further evaluation. These results were called by telephone at the time of interpretation on 03/16/2016 at 2:48 pm to Dr. Bary Castilla , who verbally acknowledged these results. Electronically Signed   By: Marlan Palau M.D.   On: 03/16/2016 14:48   Dg Chest Portable 1 View  Result Date: 03/16/2016 CLINICAL DATA:  Fever.  Altered mental status. EXAM: PORTABLE CHEST 1 VIEW COMPARISON:  02/15/2016 FINDINGS: Slight cardiomegaly. Tortuosity and calcification of the thoracic aorta. The pulmonary vascularity is normal and the lungs are clear. No acute bone abnormality. Severe arthritic changes at the right shoulder. IMPRESSION: No acute abnormality.  Chronic slight cardiomegaly. Aortic atherosclerosis. Electronically Signed   By: Francene Boyers M.D.   On: 03/16/2016 10:45    Howard Pouch, MD 03/16/2016, 12:20 PM PGY-1, [facility] FPTS Intern pager: [phone], text pages welcome   Upper Level Addendum:  I have seen and evaluated this patient along with Dr. Mosetta Putt and  reviewed the above note, making necessary revisions in pink.  Erasmo Downer, MD, MPH PGY-3,  [facility] 03/16/2016 5:05 PM

## 2016-03-16 NOTE — ED Notes (Signed)
Attempted report x1. 

## 2016-03-16 NOTE — ED Notes (Signed)
Per Dr. Phillips Odor. Try pain meds first to get pt pressure down.

## 2016-03-16 NOTE — ED Provider Notes (Signed)
[facility]EMER[facility]ENCY DEPT Provider Note   CSN: [id number] Arrival date & time: 03/16/16  4098     History   Chief Complaint Chief Complaint  Patient presents with  . Altered Mental Status    HPI Kayla Li is a 83 y.o. female.  HPI  Kayla Li is a 83 y.o. female with history of dementia, CHF, MS, GI bleed, stroke, chronic kidney disease, anemia, presents to emergency department complaining of altered mental status. Patient is coming from home. Patient apparently with 5 days of fever. Pt with recent admission 1 month ago for similar presentation, diagnosed with left thigh cellulitis. Pt finished antibiotic course and was placed into rehab from which she was discharged with home health 9 days ago. Daughter states since discharge, she has not been able to walk, she is not eating, she is more altered than normal. Her wound on left hip appears to be healing well and looks better according to daughter. Pt unable to provide any history.    Past Medical History:  Diagnosis Date  . Anemia   . CKD (chronic kidney disease)   . Dementia   . Diastolic CHF (HCC)    History of severe LV dysfunction with LVEF 20%, resolved with LVEF 65% 11/2010  . GI bleeding   . Glaucoma   . High cholesterol   . Hypertension   . Hypothyroidism   . MS (multiple sclerosis) (HCC)   . Stroke [facility])     Patient Active Problem List   Diagnosis Date Noted  . Cellulitis of left lower extremity   . Somnolence   . Alzheimer disease   . Cellulitis 02/16/2016  . Dehydration   . AKI (acute kidney injury) (HCC) 02/15/2016    Past Surgical History:  Procedure Laterality Date  . CATARACT EXTRACTION, BILATERAL    . COLECTOMY     partial with anastamosis    OB History    No data available       Home Medications    Prior to Admission medications   Medication Sig Start Date End Date Taking? Authorizing Provider  aspirin EC 81 MG tablet Take 81 mg by mouth daily.    Historical Provider, MD    atorvastatin (LIPITOR) 10 MG tablet Take 10 mg by mouth at bedtime.    Historical Provider, MD  Calcium Carb-Cholecalciferol (CALCIUM 600+D3 PO) Take 1 tablet by mouth daily.    Historical Provider, MD  carvedilol (COREG) 25 MG tablet Take 25 mg by mouth 2 (two) times daily with a meal.     Historical Provider, MD  ferrous sulfate 324 (65 Fe) MG TBEC Take 1 tablet by mouth daily.    Historical Provider, MD  hydrALAZINE (APRESOLINE) 50 MG tablet Take 50 mg by mouth 3 (three) times daily.    Historical Provider, MD  levothyroxine (SYNTHROID, LEVOTHROID) 25 MCG tablet Take 25 mcg by mouth daily before breakfast.     Historical Provider, MD  UNABLE TO FIND Med Name: Med pass 120 mL by mouth 3 times daily between meals    Historical Provider, MD  vitamin B-12 (CYANOCOBALAMIN) 500 MCG tablet Take 500 mcg by mouth daily.    Historical Provider, MD    Family History Family History  Problem Relation Age of Onset  . Stroke Mother   . Stroke Father     Social History Social History  Substance Use Topics  . Smoking status: Never Smoker  . Smokeless tobacco: Never Used  . Alcohol use No     Allergies  Patient has no known allergies.   Review of Systems Review of Systems  Unable to perform ROS: Patient nonverbal  Constitutional: Positive for fever.     Physical Exam Updated Vital Signs SpO2 100%   Physical Exam  Constitutional: She appears well-developed and well-nourished. No distress.  HENT:  Head: Normocephalic and atraumatic.  Eyes: Conjunctivae and EOM are normal. Pupils are equal, round, and reactive to light.  Neck: Normal range of motion. Neck supple.  No meningismus  Cardiovascular: Normal rate, regular rhythm and normal heart sounds.   Pulmonary/Chest: Effort normal and breath sounds normal. No respiratory distress. She has no wheezes. She has no rales.  Neurological: She is alert.  Moving bilateral upper extremitis. Grip strength equal bilateral. Does not move LE on  command.   Skin: Skin is warm and dry.  Left lateral hip ulceration, 2x2cm,  With purulent drainage present. No surrounding cellulitis. No induration. No TTP  Nursing note and vitals reviewed.    ED Treatments / Results  Labs (all labs ordered are listed, but only abnormal results are displayed) Labs Reviewed  CBC WITH DIFFERENTIAL/PLATELET - Abnormal; Notable for the following:       Result Value   RBC 3.22 (*)    Hemoglobin 9.4 (*)    HCT 29.9 (*)    Neutro Abs 8.4 (*)    Lymphs Abs 0.6 (*)    All other components within normal limits  COMPREHENSIVE METABOLIC PANEL - Abnormal; Notable for the following:    Sodium 146 (*)    Glucose, Bld 192 (*)    BUN 33 (*)    Creatinine, Ser 1.22 (*)    Albumin 2.8 (*)    ALT 12 (*)    Alkaline Phosphatase 33 (*)    GFR calc non Af Amer 39 (*)    GFR calc Af Amer 45 (*)    All other components within normal limits  URINALYSIS, ROUTINE W REFLEX MICROSCOPIC - Abnormal; Notable for the following:    Protein, ur 100 (*)    Squamous Epithelial / LPF 0-5 (*)    All other components within normal limits  I-STAT CHEM 8, ED - Abnormal; Notable for the following:    Sodium 147 (*)    BUN 33 (*)    Creatinine, Ser 1.30 (*)    Glucose, Bld 189 (*)    Hemoglobin 9.5 (*)    HCT 28.0 (*)    All other components within normal limits  URINE CULTURE  CULTURE, BLOOD (ROUTINE X 2)  CULTURE, BLOOD (ROUTINE X 2)  TSH  INFLUENZA PANEL BY PCR (TYPE A & B)  I-STAT TROPOININ, ED  I-STAT CG4 LACTIC ACID, ED  POC OCCULT BLOOD, ED    EKG  EKG Interpretation None       Radiology   Ct Head Wo Contrast  Result Date: 03/16/2016 CLINICAL DATA:  Altered mental status EXAM: CT HEAD WITHOUT CONTRAST TECHNIQUE: Contiguous axial images were obtained from the base of the skull through the vertex without intravenous contrast. COMPARISON:  CT head 02/15/2016 FINDINGS: Brain: Interval development of intraventricular hemorrhage. There is blood layering in the  occipital horn bilaterally right greater than left. The blood density is less than expected for acute blood and this may be subacute hemorrhage. No parenchymal hematoma identified. No underlying mass. Generalized atrophy. Ventricular enlargement is present slightly greater than that seen previously which is probably due to early hydrocephalus as well as atrophy. Chronic microvascular ischemic change in the white matter. No acute  ischemic infarction. Vascular: Negative for hyperdense vessel. Skull: Negative Sinuses/Orbits: Mild mucosal edema in the ethmoid sinuses. Bilateral lens replacement. No orbital mass. Other: None IMPRESSION: Intraventricular hemorrhage with mild hydrocephalus. Based on the density of blood, this may be a subacute ventricular hemorrhage. This most likely is due to hypertension. Other possibilities would include amyloid, trauma, neoplasm, and vascular malformation. Consider CTA head for further evaluation. These results were called by telephone at the time of interpretation on 03/16/2016 at 2:48 pm to Dr. Bary Castilla , who verbally acknowledged these results. Electronically Signed   By: Marlan Palau M.D.   On: 03/16/2016 14:48   Dg Chest Portable 1 View  Result Date: 03/16/2016 CLINICAL DATA:  Fever.  Altered mental status. EXAM: PORTABLE CHEST 1 VIEW COMPARISON:  02/15/2016 FINDINGS: Slight cardiomegaly. Tortuosity and calcification of the thoracic aorta. The pulmonary vascularity is normal and the lungs are clear. No acute bone abnormality. Severe arthritic changes at the right shoulder. IMPRESSION: No acute abnormality.  Chronic slight cardiomegaly. Aortic atherosclerosis. Electronically Signed   By: Francene Boyers M.D.   On: 03/16/2016 10:45    Procedures Procedures (including critical care time)  Medications Ordered in ED Medications - No data to display   Initial Impression / Assessment and Plan / ED Course  I have reviewed the triage vital signs and the nursing  notes.  Pertinent labs & imaging results that were available during my care of the patient were reviewed by me and considered in my medical decision making (see chart for details).     Pt in ED with fever, worsening mental status. Recent admission for left thigh cellulitis. Pt still has a small draining wound, however, no evidence of surrounding cellulitis. Daughter states pt has been coughing some. VS show hypertension, has not had her medications. Not tachycardic. Not hypoxic. Will get labs, UA, at this time does not meet sepsis criteria.    Rectal temp 101.8. Tylenol ordered. Labs show no elevation in lactic acid or WBC. Her renal function, hgb all at baseline. TSH normal. She is receiving hydration. Influenza panel pending. CXR normal. UA normal. No evidence of meningismus. No clear source of fever. Will hold off starting antibiotics until influnza panel is back. Her regular home BP medications ordered.  Will start antibiotics, influenza negative. WIll admit for further work up. Spoke with family practice, who will admit pt.   CT head back showing intraventricular bleed. Pt hypertensive. Already had hydralazine, will add labetalol PRN every few min. Will work on lowering BP. Also spoke with family practice about CT finding, they are aware and will consult neurosurgery.     Final Clinical Impressions(s) / ED Diagnoses   Final diagnoses:  Fever, unspecified fever cause  Altered mental status, unspecified altered mental status type  Altered mental status  Hemorrhage    New Prescriptions New Prescriptions   No medications on file     Jaynie Crumble, PA-C 03/17/16 0901    Courteney Lyn Mackuen, MD 03/21/16 1749

## 2016-03-16 NOTE — ED Notes (Signed)
Spoke with atmitting MD about carden drip, will stop if another BP lower than 150

## 2016-03-16 NOTE — Progress Notes (Signed)
Pharmacy Antibiotic Note  Kayla Li is a 83 y.o. female admitted on 03/16/2016 with AMS found to be 2/2 hemorrhage. Pharmacy has been consulted for vancomycin and Zosyn dosing. Per notes, will initiate empiric antibiotics for now and reassess once cultures return.  Pt received Zosyn 3.375g IV x1 and vancomycin 1g IV x1 in ED per MD. SCr 1.3, eCrCl ~21 ml/min.  Plan: -Zosyn 3.375g IV q8h EI -Vancomycin 500mg  IV q24h -Follow-up cultures and LOT - can likely narrow soon -Monitor renal function closely, vancomycin level as indicated     Temp (24hrs), Avg:101.8 F (38.8 C), Min:101.8 F (38.8 C), Max:101.8 F (38.8 C)   Recent Labs Lab 03/16/16 1026 03/16/16 1038  WBC 9.2  --   CREATININE 1.22* 1.30*  LATICACIDVEN  --  1.19    Estimated Creatinine Clearance: 20.9 mL/min (by C-G formula based on SCr of 1.3 mg/dL (H)).    No Known Allergies  Antimicrobials this admission: 2/12 Zosyn >>  2/12 Vancomycin >>   Dose adjustments this admission: none  Microbiology results: 2/12 BCx: IP 2/12 UCx: IP   Thank you for allowing pharmacy to be a part of this patient's care.  Fredonia Highland, PharmD PGY-1 Pharmacy Resident Pager: [phone] 03/16/2016

## 2016-03-16 NOTE — ED Triage Notes (Signed)
Per ems per family pt has had decreased loc and oral intake x 4 days has run low grade fever 99.3- 100,3. No n/v/dhas iv 20 left forearm

## 2016-03-16 NOTE — ED Notes (Signed)
[employer]

## 2016-03-16 NOTE — ED Notes (Signed)
Palliative care DO Julaine Fusi [phone]

## 2016-03-16 NOTE — ED Notes (Signed)
Patient transported to CT 

## 2016-03-16 NOTE — ED Notes (Signed)
Admitting MD calling requesting to give patient PRN BP meds.

## 2016-03-16 NOTE — Progress Notes (Addendum)
Received patient from ED. Patient non-verbal but responds to pain with movement and accompanied by family members.  Family member oriented to room, bed controls and call light.  Patient resting on bed comfortably with both eyes closed.

## 2016-03-16 NOTE — Consult Note (Signed)
Consultation Note Date: 03/16/2016   Patient Name: Kayla Li  DOB: 11/02/1926  MRN: [id number]  Age / Sex: 83 y.o., female  PCP: Mirna Mires, MD Referring Physician: Uvaldo Rising, MD  Reason for Consultation: Establishing goals of care, Hospice Evaluation, Psychosocial/spiritual support and Terminal Care  HPI/Patient Profile: 83 y.o. female  with past medical history of hypertension, malnutrition, end stage dementia admitted on 03/16/2016 with AMS, poor PO intake. Found to have ICH, chronic SDH, severe HTN. From SNF, recent osteomyelitis and hip wound infection.  Clinical Assessment and Goals of Care: [name] has end stage dementia, this is progressive and she has a very poor overall prognosis. She is not a surgical candidate and prolonging her life in her current condition will only contribute to extending her suffering. I have strongly recommended comfort care as not only the most appropriate medical course of treatment but also the most compassionate way to handle her care at this point.    SUMMARY OF RECOMMENDATIONS   Family understand and agree to full comfort care.  Code Status/Advance Care Planning:  DNR    Symptom Management:   Suspect p[ain related to her hip wound and osteomyelitis-may be making her BP higher as well.  Palliative Prophylaxis:   Aspiration, Bowel Regimen, Delirium Protocol, Eye Care, Frequent Pain Assessment, Oral Care, Palliative Wound Care and Turn Reposition  Additional Recommendations (Limitations, Scope, Preferences):  Full Comfort Care  Psycho-social/Spiritual:   Desire for further Chaplaincy support:yes  Additional Recommendations: Education on Hospice  Prognosis:   < 2 weeks  Discharge Planning: Hospice facility      Primary Diagnoses: Present on Admission: . Altered mental state . Altered mental status   I have reviewed the medical  record, interviewed the patient and family, and examined the patient. The following aspects are pertinent.  Past Medical History:  Diagnosis Date  . Anemia   . CKD (chronic kidney disease)   . Dementia   . Diastolic CHF (HCC)    History of severe LV dysfunction with LVEF 20%, resolved with LVEF 65% 11/2010  . GI bleeding   . Glaucoma   . High cholesterol   . Hypertension   . Hypothyroidism   . MS (multiple sclerosis) (HCC)   . Stroke [facility])    Social History   Social History  . Marital status: Widowed    Spouse name: N/A  . Number of children: N/A  . Years of education: N/A   Social History Main Topics  . Smoking status: Never Smoker  . Smokeless tobacco: Never Used  . Alcohol use No  . Drug use: No  . Sexual activity: Not Asked   Other Topics Concern  . None   Social History Narrative  . None   Family History  Problem Relation Age of Onset  . Stroke Mother   . Stroke Father    Scheduled Meds: . levothyroxine  12.5 mcg Intravenous Daily  . sodium chloride flush  3 mL Intravenous Q12H   Continuous Infusions: . dextrose 5 % and  0.45% NaCl 75 mL/hr at 03/16/16 1516  . piperacillin-tazobactam (ZOSYN)  IV    . [START ON 03/17/2016] vancomycin     PRN Meds:.acetaminophen **OR** acetaminophen, hydrALAZINE Medications Prior to Admission:  Prior to Admission medications   Medication Sig Start Date End Date Taking? Authorizing Provider  aspirin EC 81 MG tablet Take 81 mg by mouth daily.   Yes Historical Provider, MD  atorvastatin (LIPITOR) 10 MG tablet Take 10 mg by mouth at bedtime.   Yes Historical Provider, MD  Calcium Carb-Cholecalciferol (CALCIUM 600+D3 PO) Take 1 tablet by mouth daily.   Yes Historical Provider, MD  carvedilol (COREG) 25 MG tablet Take 25 mg by mouth 2 (two) times daily with a meal.    Yes Historical Provider, MD  enalapril (VASOTEC) 10 MG tablet Take 10 mg by mouth daily.   Yes Historical Provider, MD  ferrous sulfate 324 (65 Fe) MG TBEC Take 1  tablet by mouth daily.   Yes Historical Provider, MD  hydrALAZINE (APRESOLINE) 50 MG tablet Take 50 mg by mouth 3 (three) times daily.   Yes Historical Provider, MD  levothyroxine (SYNTHROID, LEVOTHROID) 25 MCG tablet Take 25 mcg by mouth daily before breakfast.    Yes Historical Provider, MD  vitamin B-12 (CYANOCOBALAMIN) 500 MCG tablet Take 500 mcg by mouth daily.   Yes Historical Provider, MD  UNABLE TO FIND Med Name: Med pass 120 mL by mouth 3 times daily between meals    Historical Provider, MD   No Known Allergies Review of Systems  HENT: Positive for drooling.     Physical Exam  Vital Signs: BP (!) 147/41   Pulse 63   Temp 101.8 F (38.8 C) (Rectal)   Resp 17   SpO2 98%      Pain Score: Asleep   SpO2: SpO2: 98 % O2 Device:SpO2: 98 % O2 Flow Rate: .   IO: Intake/output summary:   Intake/Output Summary (Last 24 hours) at 03/16/16 1723 Last data filed at 03/16/16 1712  Gross per 24 hour  Intake          1229.75 ml  Output                0 ml  Net          1229.75 ml    LBM:   Baseline Weight:   Most recent weight:       Palliative Assessment/Data:   Flowsheet Rows   Flowsheet Row Most Recent Value  Intake Tab  Referral Department  Hospitalist  Unit at Time of Referral  ER  Palliative Care Primary Diagnosis  Neurology  Date Notified  03/16/16  Palliative Care Type  New Palliative care  Reason for referral  Counsel Regarding Hospice, Clarify Goals of Care, End of Life Care Assistance  Date of Admission  03/16/16  Date first seen by Palliative Care  03/16/16  # of days Palliative referral response time  0 Day(s)  # of days IP prior to Palliative referral  0  Clinical Assessment  Palliative Performance Scale Score  10%  Pain Max last 24 hours  Not able to report  Pain Min Last 24 hours  Not able to report  Dyspnea Max Last 24 Hours  Not able to report  Dyspnea Min Last 24 hours  Not able to report  Nausea Max Last 24 Hours  Not able to report  Nausea  Min Last 24 Hours  Not able to report  Anxiety Max Last 24 Hours  Not able  to report  Anxiety Min Last 24 Hours  Not able to report  Other Max Last 24 Hours  Not able to report  Psychosocial & Spiritual Assessment  Palliative Care Outcomes       Time Total: 70 min Greater than 50%  of this time was spent counseling and coordinating care related to the above assessment and plan.  Signed by: Anderson Malta, DO   Please contact Palliative Medicine Team phone at [phone] for questions and concerns.  For individual provider: See Loretha Stapler

## 2016-03-16 NOTE — Progress Notes (Signed)
Palliative Medicine RN Note: Rec'd call from admitting requesting urgent consult. No family present. Daughter is Aggie Cosier [phone]. Spoke with her; she will be back at [facility] at 5:45 tonight for meeting with Dr Phillips Odor between 545-600pm. Updated ED RN.  Margret Chance Lazlo Tunney, RN, BSN, [facility] 03/16/2016 4:02 PM Cell [phone] 8:00-4:00 Monday-Friday Office [phone]

## 2016-03-16 NOTE — ED Notes (Signed)
Pt responds to name and when asked and handed [place] her right arm when asked to look for a vein.

## 2016-03-17 DIAGNOSIS — L89220 Pressure ulcer of left hip, unstageable: Secondary | ICD-10-CM

## 2016-03-17 DIAGNOSIS — I671 Cerebral aneurysm, nonruptured: Secondary | ICD-10-CM

## 2016-03-17 LAB — URINE CULTURE: Culture: NO GROWTH

## 2016-03-17 MED ORDER — HYDRALAZINE HCL 20 MG/ML IJ SOLN
10.0000 mg | INTRAMUSCULAR | Status: DC | PRN
  Administered 2016-03-17: 10 mg via INTRAVENOUS
  Filled 2016-03-17: qty 1

## 2016-03-17 NOTE — Consult Note (Signed)
[facility] [employer]  Received request from [place] with PMT for family interest in [facility]. Chart reviewed and spoke with daughter Rosey Bath by phone. Rosey Bath confirmed interest. She can meet me tomorrow morning to complete paper work for transfer to [employer]. Made CSW [place] aware. Will update Jenaya when paper work complete.   Thank you,  Forrestine Him, LCSW [phone]

## 2016-03-17 NOTE — Progress Notes (Signed)
Family Medicine Teaching Service Daily Progress Note Intern Pager: [phone]  Patient name: Kayla Li Medical record number: [id number] Date of birth: 03-31-1926 Age: 83 y.o. Gender: female  Primary Care Provider: Evlyn Courier, MD Consultants: Palliative Code Status: DNR  Pt Overview and Major Events to Date:  03/17/2016 - admit to FPTS, found to have acute ventricular bleed, palliative consult, comfort care  Assessment and Plan: Kayla Li is a 83 y.o. female presenting with AMS found to have intraventricular hemorrhage, now comfort care . PMH is significant for HTN, HLD, HFpEF, MS, hypothyroidism distant CVA  AMS, found to have Intraventricular hemorrhage and possible hydrocephaly - Last known normal was evening prior to admission, GCS 8 on admission. Palliative spoke with patient's daughters last night and decision was made to pursue comfort care. - appreciate palliative recs - schedule IV tylenol - PRN morphine - DNR  Hypertensive Emergency - sustained SBP >200 despite PRN hydralazine and labetolol, DBP 50s. Most likely cause of hemorrhagic stroke.  - discontinued nicardipine drip - can consider morphine to lower BP if it causes patient discomfort  Fever, 101.38F - in setting of acute intracranial process. Infection considered, asymptomatic. CXR clear. Pressure wounds do not appear infected. WBC 9.2 - flu negative - discontinued vanc/zosyn now pursuing comfort measures only  HFpEF, stable. Euvolemic on admission. Last echo (03/15/12) EF>65% G1DD followed by [facility] cardiology. At home on coreg, enalapril, hydralazine, which were all held. - hold home meds, comfort care as above  Hypothyroidism: TSH 1.422 on admission - DC levo, comfort care  Anemia, stable - Hgb 9.4 on admission with baseline ~10.5. At home on iron supplementation - discontinue blood draws, comfort care  H/o CVA - DC home ASA, comfort care  H/o MS, stable - no home meds, unable to assess for  focal deficits at admission  Disposition: Hospice  Subjective:  Patient sleeping. Daughter available at bedside, agreeable to comfort care plan. No acute events overnight other than the patient was made full comfort care.  Objective: Temp:  [97.9 F (36.6 C)-101.8 F (38.8 C)] 98.4 F (36.9 C) (02/13 0422) Pulse Rate:  [46-115] 66 (02/13 0422) Resp:  [12-23] 19 (02/13 0422) BP: (131-227)/(39-85) 170/40 (02/13 0422) SpO2:  [91 %-100 %] 99 % (02/13 0422) Weight:  [88 lb 6.5 oz (40.1 kg)] 88 lb 6.5 oz (40.1 kg) (02/12 2030) Physical Exam: General: Sleeps comfortably, did not attempt to wake the patient Cardiovascular: RRR, no murmurs rubs or gallops Respiratory: CTA Abdomen: Soft nontender, nondistended Extremities: No LE edema  Laboratory:  Recent Labs Lab 03/16/16 1026 03/16/16 1038  WBC 9.2  --   HGB 9.4* 9.5*  HCT 29.9* 28.0*  PLT 179  --     Recent Labs Lab 03/16/16 1026 03/16/16 1038  NA 146* 147*  K 3.8 3.7  CL 111 110  CO2 26  --   BUN 33* 33*  CREATININE 1.22* 1.30*  CALCIUM 9.0  --   PROT 6.6  --   BILITOT 0.6  --   ALKPHOS 33*  --   ALT 12*  --   AST 15  --   GLUCOSE 192* 189*    Imaging/Diagnostic Tests: TSH 1.422 Troponin 0.07 Lactic Acid 1.19  Ct Angio Head W Or Wo Contrast  Addendum Date: 03/16/2016   ADDENDUM REPORT: 03/16/2016 16:44 ADDENDUM: Study discussed by telephone with Dr. Donnella Sham on 03/16/2016 at 1638 hours. We discussed that although subacute aneurysmal hemorrhage with redistribution into the ventricles could explain this clinical picture -  intraventricular hemorrhage can also be seen in the elderly from relatively minor trauma such as fall from standing. Electronically Signed   By: Odessa Fleming M.D.   On: 03/16/2016 16:44   Result Date: 03/16/2016 CLINICAL DATA:  83 year old female with intraventricular hemorrhage discovered during evaluation of altered mental status. Initial encounter. EXAM: CT ANGIOGRAPHY HEAD TECHNIQUE:  Multidetector CT imaging of the head was performed using the standard protocol during bolus administration of intravenous contrast. Multiplanar CT image reconstructions and MIPs were obtained to evaluate the vascular anatomy. CONTRAST:  50 mL Isovue 370 COMPARISON:  Head CT without contrast 1431 hours today, and 02/15/2016. FINDINGS: CTA HEAD Posterior circulation: Mildly dominant left vertebral artery. The visible cervical vertebral arteries are tortuous but without stenosis. There is mild calcified plaque in the right V4 segment which does not appear hemodynamically significant. No distal left vertebral artery stenosis. Left PICA origin is patent. Right AICA appears dominant. The basilar artery is patent without stenosis. At the basilar tip there is an oval saccular aneurysm measuring 5-6 mm directed superiorly and posteriorly (series 407, image 21, series 402 image 115, series 404, image 104 and series 405, image 24). The anterior superior wall of the aneurysm is partially calcified. The tip of the aneurysm is mildly irregular. That aneurysm does incorporate the origins of both posterior cerebral arteries. The SCA origins are less affected. Those origins remain patent. Posterior communicating arteries are diminutive or absent. The bilateral PCA P1 and P2 segments are mildly to moderately irregular. The left P1 segment has a beaded appearance (series 405, image 24). Up to moderate stenosis occurs bilaterally. However, there is preserved bilateral distal PCA flow. Anterior circulation: The visible carotid bifurcations are patent. There is no stenosis at the left carotid bifurcation. The cervical ICAs are tortuous, more so the right. Both ICA siphons are patent. There is mild for age bilateral siphon calcified plaque without stenosis. There is a subtle infundibulum of the distal right ICA. The right carotid terminus is normal. The ACA origins are within normal limits. The anterior communicating artery is diminutive  or absent. There is mild to moderate ectasia of the proximal right A2 segment (series 406, image 21), but otherwise the bilateral ACA branches are within normal limits. The right MCA M1 segment is normal. The right MCA bifurcation is patent with mild ectasia but no discrete aneurysm (series 406, image 13). The right MCA branches are mildly irregular. Arising from the proximal left M1 segment posteriorly (series 403, images 100 and 101) there is a saccular but broad-based aneurysm encompassing 6 x 6 x 8 mm. The aneurysm is directed posteriorly and slightly superiorly. The tip of the aneurysm is mildly irregular (series 404, image 128). See series 407 image 21, series 402, images 108 an 109, and series 404 image 128. There is no associated stenosis of the left M1 segment. Distal to the aneurysm the left M1 is within normal limits. At the bifurcation there is mild irregularity and dolichoectasia of the proximal M2 branches with no other discrete left MCA aneurysm. Venous sinuses: Appear normal. Anatomic variants: None. Delayed phase: Stable intraventricular hemorrhage, more so in the right occipital horn than the left. No abnormal parenchymal enhancement. No other recent intracranial hemorrhage identified. IMPRESSION: 1. Positive for two intracranial aneurysms: Basilar tip (5-6 mm) and left MCA M1 segment (8 mm). The intraventricular hemorrhage, which is new since 02/15/2016, therefore raises the possibility of recent (subacute) aneurysm related subarachnoid hemorrhage which has now redistributed into the ventricles. Recommend Neuroendovascular and/or  Neurosurgical consultation. 2. Other intracranial dolichoectasia but no other discrete intracranial aneurysm. Intracranial atherosclerosis but no hemodynamically carotid or vertebrobasilar stenosis. There is up to moderate bilateral PCA stenosis. 3. Stable intraventricular hemorrhage and CT appearance of the brain since 1431 hours today. Electronically Signed: By: Odessa Fleming  M.D. On: 03/16/2016 16:30   Ct Head Wo Contrast  Result Date: 03/16/2016 CLINICAL DATA:  Altered mental status EXAM: CT HEAD WITHOUT CONTRAST TECHNIQUE: Contiguous axial images were obtained from the base of the skull through the vertex without intravenous contrast. COMPARISON:  CT head 02/15/2016 FINDINGS: Brain: Interval development of intraventricular hemorrhage. There is blood layering in the occipital horn bilaterally right greater than left. The blood density is less than expected for acute blood and this may be subacute hemorrhage. No parenchymal hematoma identified. No underlying mass. Generalized atrophy. Ventricular enlargement is present slightly greater than that seen previously which is probably due to early hydrocephalus as well as atrophy. Chronic microvascular ischemic change in the white matter. No acute ischemic infarction. Vascular: Negative for hyperdense vessel. Skull: Negative Sinuses/Orbits: Mild mucosal edema in the ethmoid sinuses. Bilateral lens replacement. No orbital mass. Other: None IMPRESSION: Intraventricular hemorrhage with mild hydrocephalus. Based on the density of blood, this may be a subacute ventricular hemorrhage. This most likely is due to hypertension. Other possibilities would include amyloid, trauma, neoplasm, and vascular malformation. Consider CTA head for further evaluation. These results were called by telephone at the time of interpretation on 03/16/2016 at 2:48 pm to Dr. Bary Castilla , who verbally acknowledged these results. Electronically Signed   By: Marlan Palau M.D.   On: 03/16/2016 14:48   Dg Chest Portable 1 View  Result Date: 03/16/2016 CLINICAL DATA:  Fever.  Altered mental status. EXAM: PORTABLE CHEST 1 VIEW COMPARISON:  02/15/2016 FINDINGS: Slight cardiomegaly. Tortuosity and calcification of the thoracic aorta. The pulmonary vascularity is normal and the lungs are clear. No acute bone abnormality. Severe arthritic changes at the right  shoulder. IMPRESSION: No acute abnormality.  Chronic slight cardiomegaly. Aortic atherosclerosis. Electronically Signed   By: Francene Boyers M.D.   On: 03/16/2016 10:45    Howard Pouch, MD 03/17/2016, 9:53 AM PGY-1, [facility] FPTS Intern pager: [phone], text pages welcome

## 2016-03-17 NOTE — Progress Notes (Signed)
Nutrition Brief Note  Chart reviewed. Pt now transitioning to comfort care.  No further nutrition interventions warranted at this time.  Please re-consult as needed.   Makael Stein A. Kortlynn Poust, RD, LDN, CDE Pager: [phone] After hours Pager: [phone]  

## 2016-03-17 NOTE — Progress Notes (Signed)
Notified MD of pt's BP being elevated.  Explained to MD pain medicine was just given.  MD asked to recheck BP after the administration of pain medicine to see if it has changed and if still elevated to notify her again, however they are just trying to treat her symptoms with pain medicine.  Request carried out.  BP still elevated.  MD made aware.  No new orders.  Will continue to monitor pt.

## 2016-03-17 NOTE — Consult Note (Addendum)
WOC Nurse wound consult note Reason for Consult: WOC consult requested for left hip wound. Pt is now with comfort care goals and family at bedside; will not assess wound at this time. Site was noted to be "palm-sized with pus-like drainage" in the bedside nurses notes, and H&P stated it was a stage 3 pressure injury.  Pressure Injury POA: Yes Dressing procedure/placement/frequency: ABD pad and tape to left hip wound, change PRN when soiled; this will not promote healing but is consistent with palliative care goals to minimize discomfort and absorb drainage. Please re-consult if further assistance is needed.  Thank-you,  Cammie Mcgee MSN, RN, CWOCN, [place], CNS [phone]

## 2016-03-17 NOTE — Progress Notes (Signed)
   03/17/16 0900  Clinical Encounter Type  Visited With Patient and family together  Visit Type Initial;Spiritual support  Referral From Nurse  Consult/Referral To Chaplain  Spiritual Encounters  Spiritual Needs Emotional  Stress Factors  Patient Stress Factors None identified  Family Stress Factors Loss;Major life changes    Chaplain provided ministry of presence and emotional support

## 2016-03-17 NOTE — Progress Notes (Signed)
Palliative Medicine RN Note: Followed up with daughter Rosey Bath at Dr Lamar Blinks request. Rosey Bath states that they cannot take the patient home at this time and would like inpatient hospice. Discussed options and offered choice. Family would like to use [facility]; I contacted [facility] rep Carley Hammed about referral at family request.  Margret Chance. Hoorain Kozakiewicz, RN, BSN, [facility] 03/17/2016 1:49 PM Cell [phone] 8:00-4:00 Monday-Friday Office [phone]

## 2016-03-18 NOTE — Progress Notes (Signed)
Wasted 3 mg morphine iv in sink. Witnessed by Fonnie Mu, RN

## 2016-03-18 NOTE — Progress Notes (Signed)
Called report to [place], Charity fundraiser at [employer]. Await transport.

## 2016-03-18 NOTE — Discharge Summary (Signed)
Family Medicine Teaching [facility] Discharge Summary  Patient name: Kayla Li Medical record number: [id number] Date of birth: 1926/10/01 Age: 83 y.o. Gender: female Date of Admission: 03/16/2016  Date of Discharge: 03/18/2016 Admitting Physician: Uvaldo Rising, MD  Primary Care Provider: Evlyn Courier, MD Consultants: Thyroid cancer  Indication for Hospitalization: Altered mental status  Discharge Diagnoses/Problem List:  Patient Active Problem List   Diagnosis Date Noted  . Altered mental state 03/16/2016  . Altered mental status 03/16/2016  . Cerebral aneurysm 03/16/2016  . Decubitus ulcer of left hip, unstageable (HCC) 03/16/2016  . Encephalopathy   . Fever   . Nontraumatic intraventricular intracerebral hemorrhage (HCC)   . Accelerated hypertension   . History of CVA (cerebrovascular accident)   . Cellulitis of left lower extremity   . Somnolence   . Alzheimer disease   . Cellulitis 02/16/2016  . Dehydration   . AKI (acute kidney injury) (HCC) 02/15/2016     Disposition: [facility]  Discharge Condition: Hospice  Discharge Exam: Please see progress note dated the day of discharge  Brief Hospital Course:  83 year old female presented with altered mental status of one days duration. Last known normal was the evening prior to admission, when she was speaking with her daughter and eating dinner. On admission GCS 8, concerning for possible stroke. Patient was found to have subacute intraventricular hemorrhage and possible hydrocephaly on CT head and CTA. This is thought to most likely be secondary to an aneurysm which can be seen on CTA. Patient was noted to be very hypertensive over 200 systolic on admission with diastolic of 50s. Blood pressure is not responsive to medications in the ED, and she was started on a new carnitine drip. Once imaging came back and showed the intraventricular hemorrhage palliative care was consulted. Poor prognosis for return to function.  Family and palliative ultimately made the patient comfort care only.  Scheduled IV Tylenol and when necessary morphine. Blood pressures were controlled with morphine. When necessary hydralazine was added for further blood pressure control if patient seemed uncomfortable.  Patient was discharged to inpatient hospice  Issues for Follow Up:  1. Comfort care.  Significant Procedures: None  Significant Labs and Imaging:   Recent Labs Lab 03/16/16 1026 03/16/16 1038  WBC 9.2  --   HGB 9.4* 9.5*  HCT 29.9* 28.0*  PLT 179  --     Recent Labs Lab 03/16/16 1026 03/16/16 1038  NA 146* 147*  K 3.8 3.7  CL 111 110  CO2 26  --   GLUCOSE 192* 189*  BUN 33* 33*  CREATININE 1.22* 1.30*  CALCIUM 9.0  --   ALKPHOS 33*  --   AST 15  --   ALT 12*  --   ALBUMIN 2.8*  --     Ct Angio Head W Or Wo Contrast  Addendum Date: 03/16/2016   ADDENDUM REPORT: 03/16/2016 16:44 ADDENDUM: Study discussed by telephone with Dr. Donnella Sham on 03/16/2016 at 1638 hours. We discussed that although subacute aneurysmal hemorrhage with redistribution into the ventricles could explain this clinical picture - intraventricular hemorrhage can also be seen in the elderly from relatively minor trauma such as fall from standing. Electronically Signed   By: Odessa Fleming M.D.   On: 03/16/2016 16:44   Result Date: 03/16/2016 CLINICAL DATA:  83 year old female with intraventricular hemorrhage discovered during evaluation of altered mental status. Initial encounter. EXAM: CT ANGIOGRAPHY HEAD TECHNIQUE: Multidetector CT imaging of the head was performed using the standard protocol  during bolus administration of intravenous contrast. Multiplanar CT image reconstructions and MIPs were obtained to evaluate the vascular anatomy. CONTRAST:  50 mL Isovue 370 COMPARISON:  Head CT without contrast 1431 hours today, and 02/15/2016. FINDINGS: CTA HEAD Posterior circulation: Mildly dominant left vertebral artery. The visible cervical vertebral  arteries are tortuous but without stenosis. There is mild calcified plaque in the right V4 segment which does not appear hemodynamically significant. No distal left vertebral artery stenosis. Left PICA origin is patent. Right AICA appears dominant. The basilar artery is patent without stenosis. At the basilar tip there is an oval saccular aneurysm measuring 5-6 mm directed superiorly and posteriorly (series 407, image 21, series 402 image 115, series 404, image 104 and series 405, image 24). The anterior superior wall of the aneurysm is partially calcified. The tip of the aneurysm is mildly irregular. That aneurysm does incorporate the origins of both posterior cerebral arteries. The SCA origins are less affected. Those origins remain patent. Posterior communicating arteries are diminutive or absent. The bilateral PCA P1 and P2 segments are mildly to moderately irregular. The left P1 segment has a beaded appearance (series 405, image 24). Up to moderate stenosis occurs bilaterally. However, there is preserved bilateral distal PCA flow. Anterior circulation: The visible carotid bifurcations are patent. There is no stenosis at the left carotid bifurcation. The cervical ICAs are tortuous, more so the right. Both ICA siphons are patent. There is mild for age bilateral siphon calcified plaque without stenosis. There is a subtle infundibulum of the distal right ICA. The right carotid terminus is normal. The ACA origins are within normal limits. The anterior communicating artery is diminutive or absent. There is mild to moderate ectasia of the proximal right A2 segment (series 406, image 21), but otherwise the bilateral ACA branches are within normal limits. The right MCA M1 segment is normal. The right MCA bifurcation is patent with mild ectasia but no discrete aneurysm (series 406, image 13). The right MCA branches are mildly irregular. Arising from the proximal left M1 segment posteriorly (series 403, images 100 and  101) there is a saccular but broad-based aneurysm encompassing 6 x 6 x 8 mm. The aneurysm is directed posteriorly and slightly superiorly. The tip of the aneurysm is mildly irregular (series 404, image 128). See series 407 image 21, series 402, images 108 an 109, and series 404 image 128. There is no associated stenosis of the left M1 segment. Distal to the aneurysm the left M1 is within normal limits. At the bifurcation there is mild irregularity and dolichoectasia of the proximal M2 branches with no other discrete left MCA aneurysm. Venous sinuses: Appear normal. Anatomic variants: None. Delayed phase: Stable intraventricular hemorrhage, more so in the right occipital horn than the left. No abnormal parenchymal enhancement. No other recent intracranial hemorrhage identified. IMPRESSION: 1. Positive for two intracranial aneurysms: Basilar tip (5-6 mm) and left MCA M1 segment (8 mm). The intraventricular hemorrhage, which is new since 02/15/2016, therefore raises the possibility of recent (subacute) aneurysm related subarachnoid hemorrhage which has now redistributed into the ventricles. Recommend Neuroendovascular and/or Neurosurgical consultation. 2. Other intracranial dolichoectasia but no other discrete intracranial aneurysm. Intracranial atherosclerosis but no hemodynamically carotid or vertebrobasilar stenosis. There is up to moderate bilateral PCA stenosis. 3. Stable intraventricular hemorrhage and CT appearance of the brain since 1431 hours today. Electronically Signed: By: Odessa Fleming M.D. On: 03/16/2016 16:30   Ct Head Wo Contrast  Result Date: 03/16/2016 CLINICAL DATA:  Altered mental status EXAM: CT  HEAD WITHOUT CONTRAST TECHNIQUE: Contiguous axial images were obtained from the base of the skull through the vertex without intravenous contrast. COMPARISON:  CT head 02/15/2016 FINDINGS: Brain: Interval development of intraventricular hemorrhage. There is blood layering in the occipital horn bilaterally  right greater than left. The blood density is less than expected for acute blood and this may be subacute hemorrhage. No parenchymal hematoma identified. No underlying mass. Generalized atrophy. Ventricular enlargement is present slightly greater than that seen previously which is probably due to early hydrocephalus as well as atrophy. Chronic microvascular ischemic change in the white matter. No acute ischemic infarction. Vascular: Negative for hyperdense vessel. Skull: Negative Sinuses/Orbits: Mild mucosal edema in the ethmoid sinuses. Bilateral lens replacement. No orbital mass. Other: None IMPRESSION: Intraventricular hemorrhage with mild hydrocephalus. Based on the density of blood, this may be a subacute ventricular hemorrhage. This most likely is due to hypertension. Other possibilities would include amyloid, trauma, neoplasm, and vascular malformation. Consider CTA head for further evaluation. These results were called by telephone at the time of interpretation on 03/16/2016 at 2:48 pm to Dr. Bary Castilla , who verbally acknowledged these results. Electronically Signed   By: Marlan Palau M.D.   On: 03/16/2016 14:48   Dg Chest Portable 1 View  Result Date: 03/16/2016 CLINICAL DATA:  Fever.  Altered mental status. EXAM: PORTABLE CHEST 1 VIEW COMPARISON:  02/15/2016 FINDINGS: Slight cardiomegaly. Tortuosity and calcification of the thoracic aorta. The pulmonary vascularity is normal and the lungs are clear. No acute bone abnormality. Severe arthritic changes at the right shoulder. IMPRESSION: No acute abnormality.  Chronic slight cardiomegaly. Aortic atherosclerosis. Electronically Signed   By: Francene Boyers M.D.   On: 03/16/2016 10:45    Results/Tests Pending at Time of Discharge: None  Discharge Medications:  Allergies as of 03/18/2016   No Known Allergies     Medication List    STOP taking these medications   aspirin EC 81 MG tablet   atorvastatin 10 MG tablet Commonly known as:   LIPITOR   CALCIUM 600+D3 PO   carvedilol 25 MG tablet Commonly known as:  COREG   enalapril 10 MG tablet Commonly known as:  VASOTEC   ferrous sulfate 324 (65 Fe) MG Tbec   hydrALAZINE 50 MG tablet Commonly known as:  APRESOLINE   levothyroxine 25 MCG tablet Commonly known as:  SYNTHROID, LEVOTHROID   UNABLE TO FIND   vitamin B-12 500 MCG tablet Commonly known as:  CYANOCOBALAMIN       Discharge Instructions: Please refer to Patient Instructions section of EMR for full details.  Patient was counseled important signs and symptoms that should prompt return to medical care, changes in medications, dietary instructions, activity restrictions, and follow up appointments.   Follow-Up Appointments:   Howard Pouch, MD 03/18/2016, 10:11 AM PGY-1, [facility]

## 2016-03-18 NOTE — Progress Notes (Signed)
Family Medicine Teaching Service Daily Progress Note Intern Pager: [phone]  Patient name: Kayla Li Medical record number: [id number] Date of birth: 1926-09-18 Age: 83 y.o. Gender: female  Primary Care Provider: Evlyn Courier, MD Consultants: Palliative Code Status: DNR  Pt Overview and Major Events to Date:  03/17/2016 - admit to FPTS, found to have acute ventricular bleed, palliative consult, comfort care  Assessment and Plan: Kayla Li is a 83 y.o. female presenting with AMS found to have intraventricular hemorrhage, now comfort care . PMH is significant for HTN, HLD, HFpEF, MS, hypothyroidism distant CVA  AMS, found to have Intraventricular hemorrhage and possible hydrocephaly - Last known normal was evening prior to admission, GCS 8 on admission. Palliative spoke with patient's daughters last night and decision was made to pursue comfort care. - appreciate palliative recs - schedule IV tylenol - PRN morphine - PRN hydralazine was added out of concern elevated pressures may contribute to discomfort - DNR - Plan for inpt hospice transfer later today  Hypertensive Emergency - sustained SBP >200 despite PRN hydralazine and labetolol, DBP 50s. Most likely cause of hemorrhagic stroke.  - discontinued nicardipine drip - can consider morphine to lower BP if it causes patient discomfort  Fever, on admission 101.81F - in setting of acute intracranial process. Infection considered, asymptomatic. CXR clear. Pressure wounds do not appear infected. WBC 9.2 - flu negative - discontinued vanc/zosyn now pursuing comfort measures only  HFpEF, stable. Euvolemic on admission. Last echo (03/15/12) EF>65% G1DD followed by [facility] cardiology. At home on coreg, enalapril, hydralazine, which were all held. - hold home meds, comfort care as above  Hypothyroidism: TSH 1.422 on admission - DC levo, comfort care  Anemia, stable - Hgb 9.4 on admission with baseline ~10.5. At home on iron  supplementation - discontinue blood draws, comfort care  H/o CVA - DC home ASA, comfort care  H/o MS, stable - no home meds, unable to assess for focal deficits at admission  Disposition: Hospice  Subjective:  Patient sleeping. Did not attempt to arouse. Pressures were elevated overnight, hydralazine was put on as a comfort measure. Family is not at bedside this AM.  Objective: Temp:  [98.2 F (36.8 C)-100.3 F (37.9 C)] 98.2 F (36.8 C) (02/14 0610) Pulse Rate:  [52-67] 60 (02/14 0610) Resp:  [18-19] 19 (02/14 0610) BP: (202-226)/(38-57) 215/43 (02/14 0610) SpO2:  [96 %-99 %] 96 % (02/14 0610) Physical Exam: General: Sleeps, did not open eyes or respond to commands Cardiovascular: RRR, no murmurs rubs or gallops Respiratory: CTA Abdomen: Soft nontender, nondistended Extremities: No LE edema  Laboratory:  Recent Labs Lab 03/16/16 1026 03/16/16 1038  WBC 9.2  --   HGB 9.4* 9.5*  HCT 29.9* 28.0*  PLT 179  --     Recent Labs Lab 03/16/16 1026 03/16/16 1038  NA 146* 147*  K 3.8 3.7  CL 111 110  CO2 26  --   BUN 33* 33*  CREATININE 1.22* 1.30*  CALCIUM 9.0  --   PROT 6.6  --   BILITOT 0.6  --   ALKPHOS 33*  --   ALT 12*  --   AST 15  --   GLUCOSE 192* 189*    Imaging/Diagnostic Tests: TSH 1.422 Troponin 0.07 Lactic Acid 1.19  Ct Angio Head W Or Wo Contrast  Addendum Date: 03/16/2016   ADDENDUM REPORT: 03/16/2016 16:44 ADDENDUM: Study discussed by telephone with Dr. Donnella Sham on 03/16/2016 at 1638 hours. We discussed that although subacute aneurysmal hemorrhage with  redistribution into the ventricles could explain this clinical picture - intraventricular hemorrhage can also be seen in the elderly from relatively minor trauma such as fall from standing. Electronically Signed   By: Odessa Fleming M.D.   On: 03/16/2016 16:44   Result Date: 03/16/2016 CLINICAL DATA:  83 year old female with intraventricular hemorrhage discovered during evaluation of altered  mental status. Initial encounter. EXAM: CT ANGIOGRAPHY HEAD TECHNIQUE: Multidetector CT imaging of the head was performed using the standard protocol during bolus administration of intravenous contrast. Multiplanar CT image reconstructions and MIPs were obtained to evaluate the vascular anatomy. CONTRAST:  50 mL Isovue 370 COMPARISON:  Head CT without contrast 1431 hours today, and 02/15/2016. FINDINGS: CTA HEAD Posterior circulation: Mildly dominant left vertebral artery. The visible cervical vertebral arteries are tortuous but without stenosis. There is mild calcified plaque in the right V4 segment which does not appear hemodynamically significant. No distal left vertebral artery stenosis. Left PICA origin is patent. Right AICA appears dominant. The basilar artery is patent without stenosis. At the basilar tip there is an oval saccular aneurysm measuring 5-6 mm directed superiorly and posteriorly (series 407, image 21, series 402 image 115, series 404, image 104 and series 405, image 24). The anterior superior wall of the aneurysm is partially calcified. The tip of the aneurysm is mildly irregular. That aneurysm does incorporate the origins of both posterior cerebral arteries. The SCA origins are less affected. Those origins remain patent. Posterior communicating arteries are diminutive or absent. The bilateral PCA P1 and P2 segments are mildly to moderately irregular. The left P1 segment has a beaded appearance (series 405, image 24). Up to moderate stenosis occurs bilaterally. However, there is preserved bilateral distal PCA flow. Anterior circulation: The visible carotid bifurcations are patent. There is no stenosis at the left carotid bifurcation. The cervical ICAs are tortuous, more so the right. Both ICA siphons are patent. There is mild for age bilateral siphon calcified plaque without stenosis. There is a subtle infundibulum of the distal right ICA. The right carotid terminus is normal. The ACA origins are  within normal limits. The anterior communicating artery is diminutive or absent. There is mild to moderate ectasia of the proximal right A2 segment (series 406, image 21), but otherwise the bilateral ACA branches are within normal limits. The right MCA M1 segment is normal. The right MCA bifurcation is patent with mild ectasia but no discrete aneurysm (series 406, image 13). The right MCA branches are mildly irregular. Arising from the proximal left M1 segment posteriorly (series 403, images 100 and 101) there is a saccular but broad-based aneurysm encompassing 6 x 6 x 8 mm. The aneurysm is directed posteriorly and slightly superiorly. The tip of the aneurysm is mildly irregular (series 404, image 128). See series 407 image 21, series 402, images 108 an 109, and series 404 image 128. There is no associated stenosis of the left M1 segment. Distal to the aneurysm the left M1 is within normal limits. At the bifurcation there is mild irregularity and dolichoectasia of the proximal M2 branches with no other discrete left MCA aneurysm. Venous sinuses: Appear normal. Anatomic variants: None. Delayed phase: Stable intraventricular hemorrhage, more so in the right occipital horn than the left. No abnormal parenchymal enhancement. No other recent intracranial hemorrhage identified. IMPRESSION: 1. Positive for two intracranial aneurysms: Basilar tip (5-6 mm) and left MCA M1 segment (8 mm). The intraventricular hemorrhage, which is new since 02/15/2016, therefore raises the possibility of recent (subacute) aneurysm related subarachnoid hemorrhage  which has now redistributed into the ventricles. Recommend Neuroendovascular and/or Neurosurgical consultation. 2. Other intracranial dolichoectasia but no other discrete intracranial aneurysm. Intracranial atherosclerosis but no hemodynamically carotid or vertebrobasilar stenosis. There is up to moderate bilateral PCA stenosis. 3. Stable intraventricular hemorrhage and CT appearance  of the brain since 1431 hours today. Electronically Signed: By: Odessa Fleming M.D. On: 03/16/2016 16:30   Ct Head Wo Contrast  Result Date: 03/16/2016 CLINICAL DATA:  Altered mental status EXAM: CT HEAD WITHOUT CONTRAST TECHNIQUE: Contiguous axial images were obtained from the base of the skull through the vertex without intravenous contrast. COMPARISON:  CT head 02/15/2016 FINDINGS: Brain: Interval development of intraventricular hemorrhage. There is blood layering in the occipital horn bilaterally right greater than left. The blood density is less than expected for acute blood and this may be subacute hemorrhage. No parenchymal hematoma identified. No underlying mass. Generalized atrophy. Ventricular enlargement is present slightly greater than that seen previously which is probably due to early hydrocephalus as well as atrophy. Chronic microvascular ischemic change in the white matter. No acute ischemic infarction. Vascular: Negative for hyperdense vessel. Skull: Negative Sinuses/Orbits: Mild mucosal edema in the ethmoid sinuses. Bilateral lens replacement. No orbital mass. Other: None IMPRESSION: Intraventricular hemorrhage with mild hydrocephalus. Based on the density of blood, this may be a subacute ventricular hemorrhage. This most likely is due to hypertension. Other possibilities would include amyloid, trauma, neoplasm, and vascular malformation. Consider CTA head for further evaluation. These results were called by telephone at the time of interpretation on 03/16/2016 at 2:48 pm to Dr. Bary Castilla , who verbally acknowledged these results. Electronically Signed   By: Marlan Palau M.D.   On: 03/16/2016 14:48   Dg Chest Portable 1 View  Result Date: 03/16/2016 CLINICAL DATA:  Fever.  Altered mental status. EXAM: PORTABLE CHEST 1 VIEW COMPARISON:  02/15/2016 FINDINGS: Slight cardiomegaly. Tortuosity and calcification of the thoracic aorta. The pulmonary vascularity is normal and the lungs are  clear. No acute bone abnormality. Severe arthritic changes at the right shoulder. IMPRESSION: No acute abnormality.  Chronic slight cardiomegaly. Aortic atherosclerosis. Electronically Signed   By: Francene Boyers M.D.   On: 03/16/2016 10:45    Howard Pouch, MD 03/18/2016, 9:03 AM PGY-1, [facility] FPTS Intern pager: [phone], text pages welcome

## 2016-03-18 NOTE — Consult Note (Signed)
[facility]  Completed paper work with daughter Rosey Bath this morning for [facility] transfer today.  RN please call report to [phone].  Please fax discharge summary to [phone].  Thank you,  Forrestine Him, LCSW [phone]

## 2016-03-18 NOTE — Clinical Social Work Note (Signed)
Pt is ready for discharge today and will go to [facility]. CSW left voicemail for pt's daughter, Satina Ivers. Daughter signed paperwork with Carley Hammed this morning for [facility].  CSW faxd d/c summary to [facility] and communicated with Carley Hammed for room and report. RN has called report. [facility] called for transportation. CSW is signing off as no further needs identified.   Corlis Hove, MSW, [facility]  Clinical Social Worker  [phone]

## 2016-03-21 LAB — CULTURE, BLOOD (ROUTINE X 2)
CULTURE: NO GROWTH
Culture: NO GROWTH

## 2016-04-02 DEATH — deceased

## 2018-12-30 IMAGING — CT CT HEAD W/O CM
3 of 4 series · 17 of 47 positions shown, 20 images · non-contrast
Comparison: CT head 02/15/2016

CLINICAL DATA: Altered mental status

EXAM:
CT HEAD WITHOUT CONTRAST
TECHNIQUE: Contiguous axial images were obtained from the base of the skull
through the vertex without intravenous contrast.

[Series 201: head w/o, idose (1) · axial · non-contrast · 0.40mm/px · z∈[+45,+170]mm · 11 of 31 slices shown, 14 images]
[im 3/31  brain]
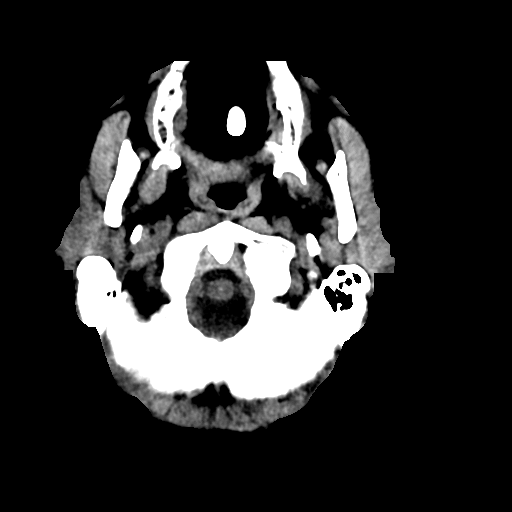
[im 3/31  bone]
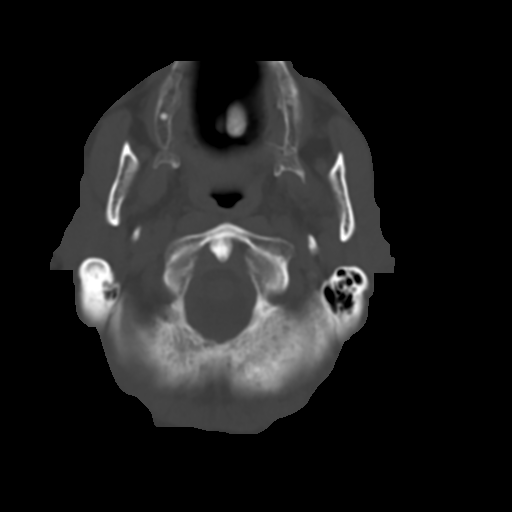
[im 5/31  brain]
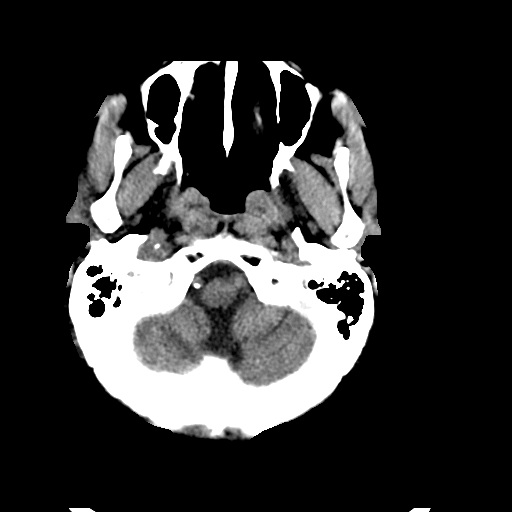
[im 7/31  brain]
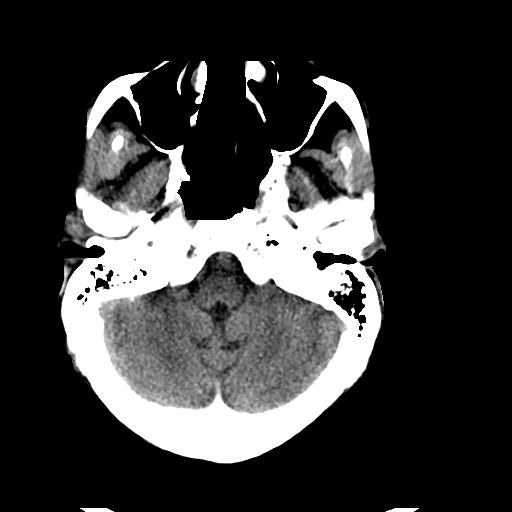
[im 11/31  brain]
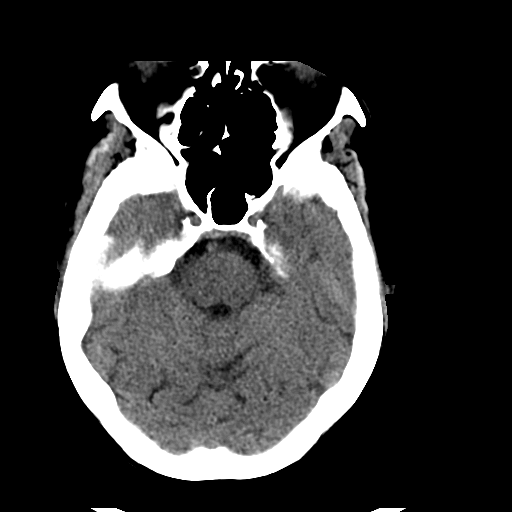
[im 13/31  brain]
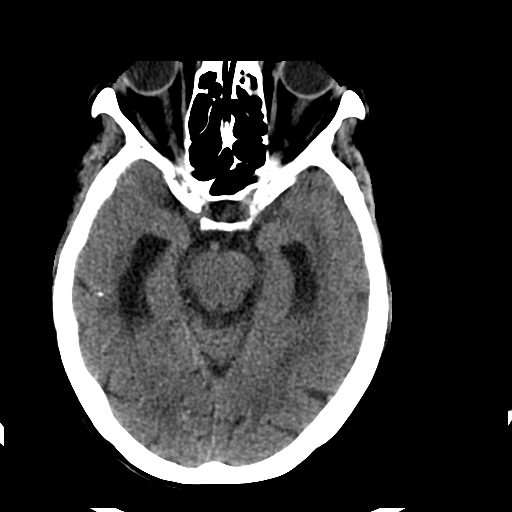
[im 13/31  bone]
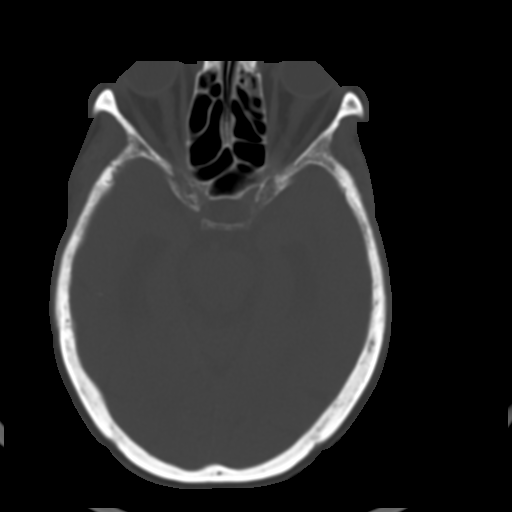
[im 16/31  brain]
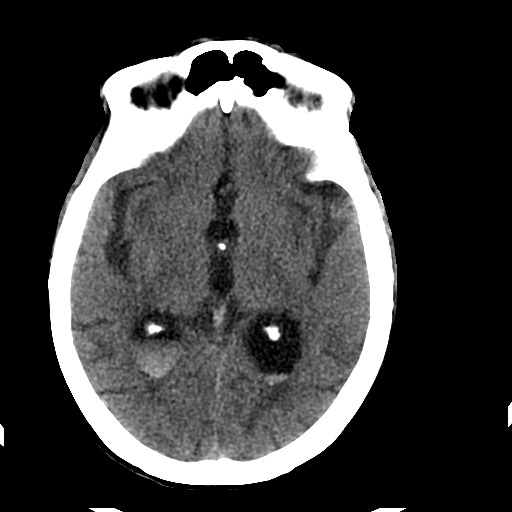
[im 18/31  brain]
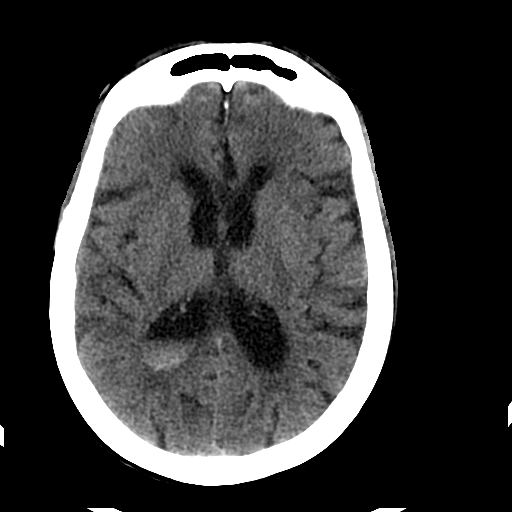
[im 20/31  brain]
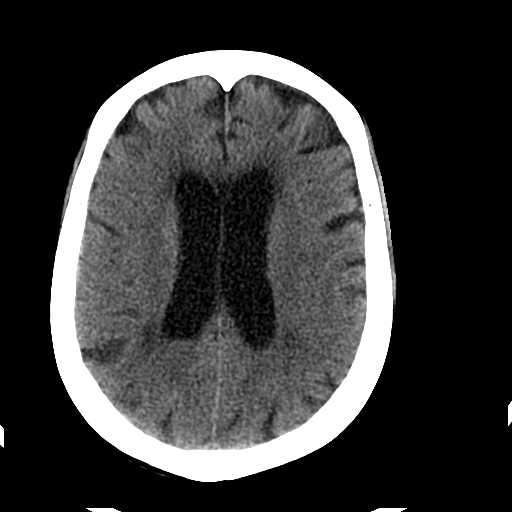
[im 24/31  brain]
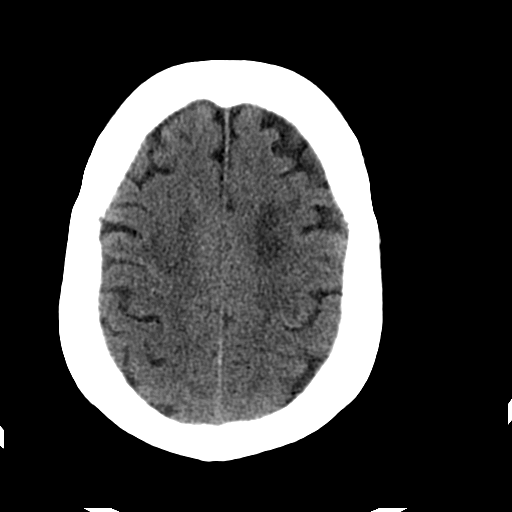
[im 24/31  bone]
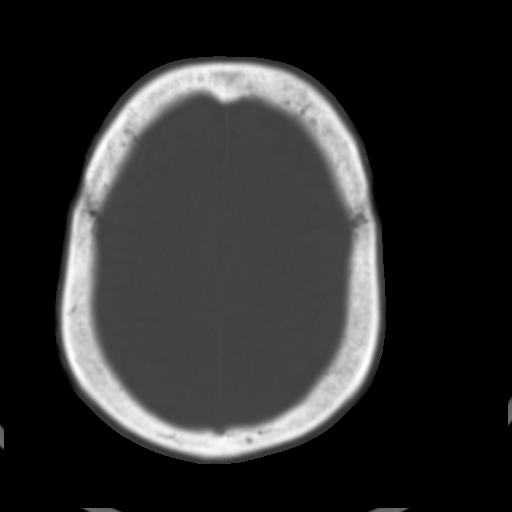
[im 26/31  brain]
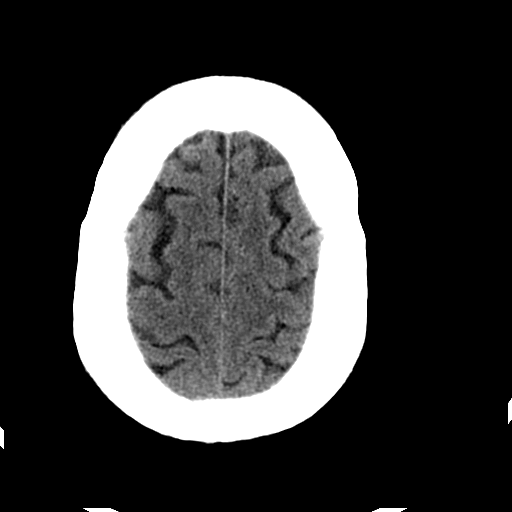
[im 28/31  brain]
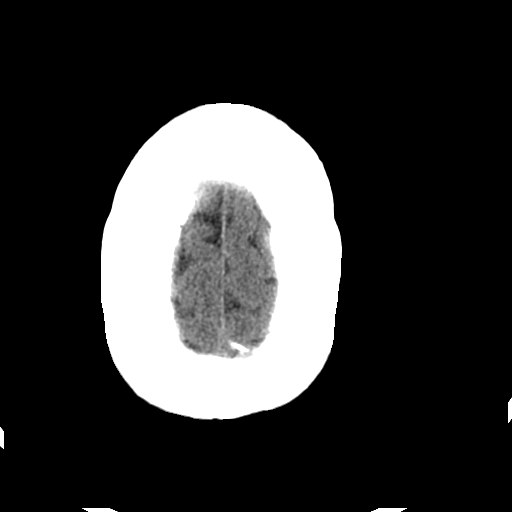

[Series 203: coronal st, idose (1) · coronal · 0.41mm/px · 3 of 68 slices shown]
[im 23/68  brain]
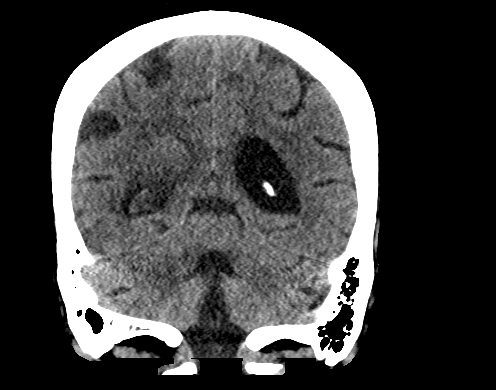
[im 30/68  brain]
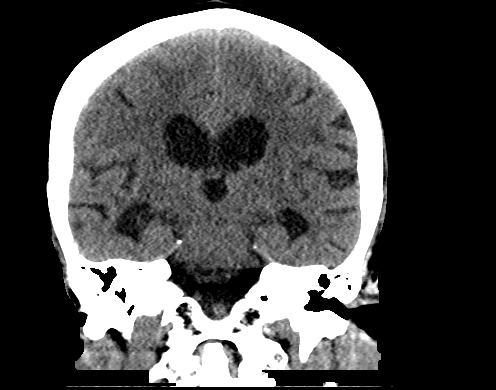
[im 38/68  brain]
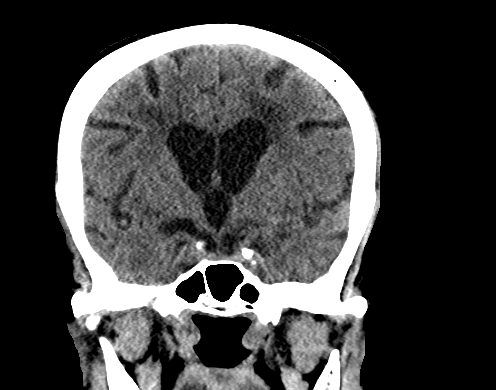

[Series 204: sagittal st, idose (1) · sagittal · 0.40mm/px · 3 of 67 slices shown]
[im 23/67  brain]
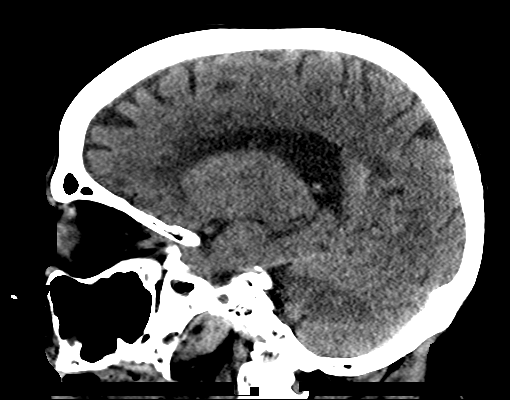
[im 34/67  brain]
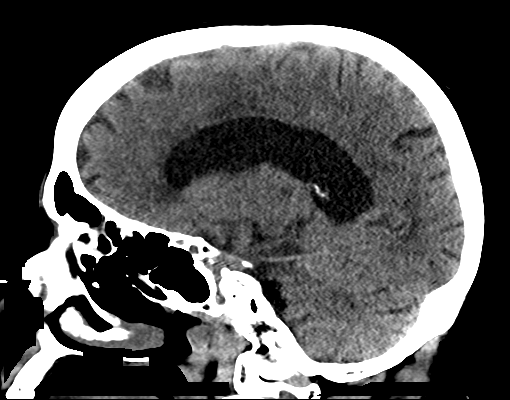
[im 45/67  brain]
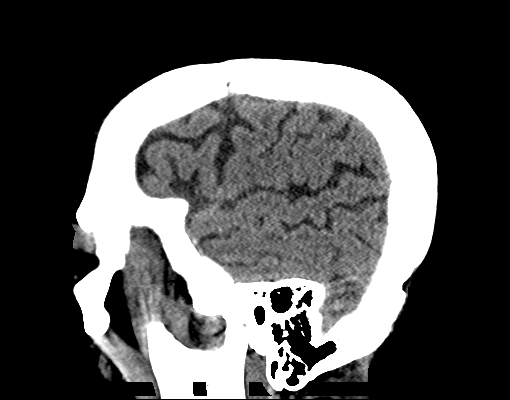

[17 of 47 positions shown; findings below may reference images not displayed]

FINDINGS: Brain: Interval development of intraventricular hemorrhage. There is
blood layering in the occipital horn bilaterally right greater than
left. The blood density is less than expected for acute blood and
this may be subacute hemorrhage. No parenchymal hematoma identified.
No underlying mass.

Generalized atrophy. Ventricular enlargement is present slightly
greater than that seen previously which is probably due to early
hydrocephalus as well as atrophy. Chronic microvascular ischemic
change in the white matter. No acute ischemic infarction.

Vascular: Negative for hyperdense vessel.

Skull: Negative

Sinuses/Orbits: Mild mucosal edema in the ethmoid sinuses. Bilateral
lens replacement. No orbital mass.

Other: None
IMPRESSION: Intraventricular hemorrhage with mild hydrocephalus. Based on the
density of blood, this may be a subacute ventricular hemorrhage.
This most likely is due to hypertension. Other possibilities would
include amyloid, trauma, neoplasm, and vascular malformation.
Consider CTA head for further evaluation.

These results were called by telephone at the time of interpretation
on 03/16/2016 at [DATE] to Dr. FHER NISHIMURA , who verbally
acknowledged these results.
